# Patient Record
Sex: Male | Born: 1950
Health system: Southern US, Community
[De-identification: ages and names within clinical notes are randomized; demographics above are authoritative.]

## PROBLEM LIST (undated history)

## (undated) DIAGNOSIS — K760 Fatty (change of) liver, not elsewhere classified: Secondary | ICD-10-CM

## (undated) DIAGNOSIS — I5042 Chronic combined systolic (congestive) and diastolic (congestive) heart failure: Secondary | ICD-10-CM

## (undated) DIAGNOSIS — F1010 Alcohol abuse, uncomplicated: Secondary | ICD-10-CM

## (undated) DIAGNOSIS — M1990 Unspecified osteoarthritis, unspecified site: Secondary | ICD-10-CM

## (undated) DIAGNOSIS — E7800 Pure hypercholesterolemia, unspecified: Secondary | ICD-10-CM

## (undated) DIAGNOSIS — F99 Mental disorder, not otherwise specified: Secondary | ICD-10-CM

## (undated) DIAGNOSIS — I10 Essential (primary) hypertension: Secondary | ICD-10-CM

## (undated) HISTORY — PX: OTHER SURGICAL HISTORY: SHX169

## (undated) HISTORY — DX: Pure hypercholesterolemia, unspecified: E78.00

## (undated) HISTORY — DX: Essential (primary) hypertension: I10

## (undated) HISTORY — DX: Chronic combined systolic (congestive) and diastolic (congestive) heart failure: I50.42

## (undated) HISTORY — DX: Fatty (change of) liver, not elsewhere classified: K76.0

## (undated) HISTORY — DX: Alcohol abuse, uncomplicated: F10.10

---

## 1980-06-07 HISTORY — PX: HIP PINNING: SHX1757

## 2011-10-31 ENCOUNTER — Emergency Department (HOSPITAL_COMMUNITY)
Admission: EM | Admit: 2011-10-31 | Discharge: 2011-10-31 | Disposition: A | Discharge status: Home / Self Care | Payer: Medicaid Other | Attending: Emergency Medicine | Admitting: Emergency Medicine

## 2011-10-31 ENCOUNTER — Encounter (HOSPITAL_COMMUNITY): Payer: Self-pay | Admitting: *Deleted

## 2011-10-31 ENCOUNTER — Emergency Department (HOSPITAL_COMMUNITY): Payer: Medicaid Other

## 2011-10-31 DIAGNOSIS — M79609 Pain in unspecified limb: Secondary | ICD-10-CM | POA: Insufficient documentation

## 2011-10-31 DIAGNOSIS — M7989 Other specified soft tissue disorders: Secondary | ICD-10-CM | POA: Insufficient documentation

## 2011-10-31 DIAGNOSIS — M25579 Pain in unspecified ankle and joints of unspecified foot: Secondary | ICD-10-CM | POA: Insufficient documentation

## 2011-10-31 DIAGNOSIS — M79671 Pain in right foot: Secondary | ICD-10-CM

## 2011-10-31 MED ORDER — NAPROXEN 500 MG PO TABS: 500.0000 mg | ORAL_TABLET | Freq: Two times a day (BID) | ORAL | Status: AC

## 2011-10-31 NOTE — ED Notes (Signed)
Right foot swollen for the past 4 days

## 2011-10-31 NOTE — ED Provider Notes (Signed)
History     CSN: [id number]  Arrival date & time 10/31/11  1153   First MD Initiated Contact with Patient 10/31/11 1249      Chief Complaint  Patient presents with  . Foot Swelling    (Consider location/radiation/quality/duration/timing/severity/associated sxs/prior treatment) HPI Comments: Patient c/o "stinging" pain and swelling to his right foot and ankle for 4 days.  He denies known injury, calf pain or swelling of the proximal leg.  He states the pain is only with weight bearing and improves with rest.    Patient is a 61 y.o. male presenting with lower extremity pain. The history is provided by the patient.  Foot Pain This is a new problem. The current episode started in the past 7 days. The problem occurs constantly. The problem has been unchanged. Associated symptoms include arthralgias and joint swelling. Pertinent negatives include no chills, fever, headaches, numbness, rash, swollen glands, vomiting or weakness. The symptoms are aggravated by standing and walking. He has tried nothing for the symptoms. The treatment provided no relief.    History reviewed. No pertinent past medical history.  History reviewed. No pertinent past surgical history.  No family history on file.  History  Substance Use Topics  . Smoking status: Former Games developer  . Smokeless tobacco: Not on file  . Alcohol Use:       Review of Systems  Constitutional: Negative for fever and chills.  Respiratory: Negative for shortness of breath.   Gastrointestinal: Negative for vomiting.  Genitourinary: Negative for dysuria and difficulty urinating.  Musculoskeletal: Positive for joint swelling and arthralgias. Negative for gait problem.  Skin: Negative for color change, rash and wound.  Neurological: Negative for weakness, numbness and headaches.  All other systems reviewed and are negative.    Allergies  Review of patient's allergies indicates no known allergies.  Home Medications  No current  outpatient prescriptions on file.  BP 156/69  Pulse 73  Temp(Src) 97.7 F (36.5 C) (Oral)  Resp 18  SpO2 99%  Physical Exam  Nursing note and vitals reviewed. Constitutional: He is oriented to person, place, and time. He appears well-developed and well-nourished. No distress.  HENT:  Head: Normocephalic and atraumatic.  Cardiovascular: Normal rate, regular rhythm, normal heart sounds and intact distal pulses.   Pulmonary/Chest: Effort normal and breath sounds normal. No respiratory distress.  Musculoskeletal: He exhibits edema and tenderness.       right ankle is ttp, mild to moderate STS is present of the dorsal foot, medial and lateral ankle.  ROM is preserved.  DP pulse is brisk, sensation intact.  No erythema, abrasion, wounds, bruising or deformity.  No proximal edema or discoloration.    Neurological: He is alert and oriented to person, place, and time. He exhibits normal muscle tone. Coordination normal.  Skin: Skin is warm and dry.    ED Course  Procedures (including critical care time)  Labs Reviewed - No data to display Dg Foot Complete Right  10/31/2011  *RADIOLOGY REPORT*  Clinical Data: Right foot swollen and painful.  No known injury.  RIGHT FOOT COMPLETE - 3+ VIEW  Comparison: None.  Findings: Joints of the foot are aligned.  There is a remote healed fracture deformity of the first metatarsal.  No acute fracture or radiopaque foreign body is identified. No bony erosions or periosteal reaction. There is diffuse soft tissue swelling over the dorsum of the foot.  IMPRESSION: Soft tissue swelling of the dorsum of the foot.  No acute bony abnormality identified.  Original Report Authenticated By: Britta Mccreedy, M.D.     MDM   Previous medical charts, nursing notes and vitals signs from this visit were reviewed by me   All laboratory results and/or imaging results performed on this visit, if applicable, were reviewed by me and discussed with the patient and/or parent as  well as recommendation for follow-up    MEDICATIONS GIVEN IN ED:  none   Diffuse STS of the right dorsal foot and ankle.  Clinical suspicion for cellulitis, or DVT is low.  Patient also  seen by EDP and care plan discussed.  Pt agrees to return or f/u with his PMD if sx's worsen   PRESCRIPTIONS GIVEN AT DISCHARGE:  naprosyn    Pt stable in ED with no significant deterioration in condition. Pt feels improved after observation and/or treatment in ED. Patient / Family / Caregiver understand and agree with initial ED impression and plan with expectations set for ED visit.  Patient agrees to return to ED for any worsening symptoms         Charisse Wendell L. Keirstin Musil, [place] 11/02/11 1213

## 2011-11-02 NOTE — ED Provider Notes (Signed)
Medical screening examination/treatment/procedure(s) were performed by non-physician practitioner and as supervising physician I was immediately available for consultation/collaboration.   Shareka Casale L Levita Monical, MD 11/02/11 1523 

## 2012-06-22 ENCOUNTER — Telehealth: Payer: Self-pay

## 2012-06-22 NOTE — Telephone Encounter (Signed)
T/C from Maryelizabeth Rowan at [facility]. She said this pt is being referred by [facility] for screening colonoscopy. I do not have a referral. She  [place] Spaniel he has a hx of substance abuse and she is not sure what. I scheduled him an OV with Gerrit Halls, NP on 07/17/2012 at 8:30 AM and [name] will bring him and be with him. Routing to Dawn to get records from [facility] ( although [facility] said that pt had a work up there but no previous records.)

## 2012-06-22 NOTE — Telephone Encounter (Signed)
Left a message with Gennaro Africa, [facility], to send me his last OV note.

## 2012-06-27 NOTE — Telephone Encounter (Signed)
Left a message again today for Gabriel Hammond to call me back regarding these records.

## 2012-06-28 NOTE — Telephone Encounter (Signed)
Received records

## 2012-06-28 NOTE — Telephone Encounter (Signed)
Gabriel Hammond from [facility]	[place] records is faxing [place] this patients records.

## 2012-07-17 ENCOUNTER — Ambulatory Visit (INDEPENDENT_AMBULATORY_CARE_PROVIDER_SITE_OTHER): Payer: Medicaid Other | Admitting: Gastroenterology

## 2012-07-17 ENCOUNTER — Encounter: Payer: Self-pay | Admitting: Gastroenterology

## 2012-07-17 VITALS — BP 157/83 | HR 103 | Temp 97.4°F | Ht 68.0 in | Wt 153.4 lb

## 2012-07-17 DIAGNOSIS — Z1211 Encounter for screening for malignant neoplasm of colon: Secondary | ICD-10-CM | POA: Insufficient documentation

## 2012-07-17 DIAGNOSIS — D649 Anemia, unspecified: Secondary | ICD-10-CM

## 2012-07-17 LAB — CBC WITH DIFFERENTIAL/PLATELET
Basophils Absolute: 0 10*3/uL (ref 0.0–0.1)
Basophils Relative: 0 % (ref 0–1)
Eosinophils Absolute: 0.1 10*3/uL (ref 0.0–0.7)
Eosinophils Relative: 1 % (ref 0–5)
HCT: 37.6 % — ABNORMAL LOW (ref 39.0–52.0)
Hemoglobin: 12.4 g/dL — ABNORMAL LOW (ref 13.0–17.0)
Lymphocytes Relative: 25 % (ref 12–46)
Lymphs Abs: 1.1 10*3/uL (ref 0.7–4.0)
MCH: 27 pg (ref 26.0–34.0)
MCHC: 33 g/dL (ref 30.0–36.0)
MCV: 81.7 fL (ref 78.0–100.0)
Monocytes Absolute: 0.4 10*3/uL (ref 0.1–1.0)
Monocytes Relative: 9 % (ref 3–12)
Neutro Abs: 2.9 10*3/uL (ref 1.7–7.7)
Neutrophils Relative %: 65 % (ref 43–77)
Platelets: 366 10*3/uL (ref 150–400)
RBC: 4.6 MIL/uL (ref 4.22–5.81)
RDW: 16.1 % — ABNORMAL HIGH (ref 11.5–15.5)
WBC: 4.5 10*3/uL (ref 4.0–10.5)

## 2012-07-17 LAB — FERRITIN: Ferritin: 799 ng/mL — ABNORMAL HIGH (ref 22–322)

## 2012-07-17 LAB — IRON: Iron: 57 ug/dL (ref 42–165)

## 2012-07-17 MED ORDER — PEG 3350-KCL-NA BICARB-NACL 420 G PO SOLR: 4000.0000 mL | ORAL | Status: DC

## 2012-07-17 NOTE — Progress Notes (Signed)
Faxed to PCP

## 2012-07-17 NOTE — Assessment & Plan Note (Signed)
Mild normocytic. hgb 12. Update CBC, ferritin, iron. EGD if IDA.

## 2012-07-17 NOTE — Assessment & Plan Note (Signed)
62 year old male presenting for initial screening colonoscopy, overdue at this time. No lower GI symptoms. He thinks his father may have had colon cancer in his 4s or 55s, but [name] is unsure. It is somewhat difficult to obtain an accurate family history from him. Upon review of labs, Hgb mild normocytic anemia noted with Hgb 12. Pt has been without a primary care physician for 30+ years.   I would like to obtain an updated CBC and establish a baseline iron and ferritin. If evidence of IDA, needs EGD at time of TCS. Heme status unknown.   Proceed with TCS+/- EGD with Dr. Jena Gauss in near future: the risks, benefits, and alternatives have been discussed with the patient in detail. The patient states understanding and desires to proceed.

## 2012-07-17 NOTE — Patient Instructions (Addendum)
We have set you up for a colonoscopy and possibly upper endoscopy with Dr. Jena Gauss in the near future.  Please complete the blood work within the next 24 hours. This will help [place] decide if we need to take a look at your esophagus and stomach at the time of the colonoscopy.
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
## 2012-07-26 LAB — CBC
HCT: 37 %
Hemoglobin: 12 g/dL — AB (ref 13.5–17.5)
MCH: 28.1
MCHC: 32.6
MCV: 86.2 fL
RBC: 4.27
RDW: 16.2
WBC: 5.2
platelet count: 280

## 2012-07-31 ENCOUNTER — Encounter (HOSPITAL_COMMUNITY): Payer: Self-pay | Admitting: Pharmacy Technician

## 2012-07-31 NOTE — Progress Notes (Signed)
Quick Note:  Hgb stable at 12.4 Iron normal Ferritin is double the upper limits of normal. Unclear etiology at this point. Could be a number of factors; we need to obtain a transferrin level. Will help [place] decide which route to take (?liver evaluation, hematology, etc) ______

## 2012-08-03 ENCOUNTER — Other Ambulatory Visit: Payer: Self-pay | Admitting: Gastroenterology

## 2012-08-03 ENCOUNTER — Other Ambulatory Visit: Payer: Self-pay

## 2012-08-03 DIAGNOSIS — R7989 Other specified abnormal findings of blood chemistry: Secondary | ICD-10-CM

## 2012-08-03 NOTE — Progress Notes (Signed)
Quick Note:  Tried to call pt- NA ______ 

## 2012-08-04 NOTE — Progress Notes (Signed)
Quick Note:  Tried to call pt- NA ______ 

## 2012-08-08 NOTE — Progress Notes (Signed)
Quick Note:  I have been unable to reach pt- Mailed letter with lab order. ______

## 2012-08-10 ENCOUNTER — Telehealth: Payer: Self-pay

## 2012-08-10 NOTE — Telephone Encounter (Signed)
Pt had received a lab order for ferritin recheck and wanted to know if he can still do his colonoscopy on Mon if he doesn't do this lab first. Per Gerrit Halls, NP , Yes and they are aware.

## 2012-08-14 ENCOUNTER — Ambulatory Visit (HOSPITAL_COMMUNITY)
Admission: RE | Admit: 2012-08-14 | Discharge: 2012-08-14 | Disposition: A | Discharge status: Home / Self Care | Payer: Medicaid Other | Source: Ambulatory Visit | Attending: Internal Medicine | Admitting: Internal Medicine

## 2012-08-14 ENCOUNTER — Encounter (HOSPITAL_COMMUNITY)
Admission: RE | Disposition: A | Discharge status: Home / Self Care | Payer: Self-pay | Source: Ambulatory Visit | Attending: Internal Medicine

## 2012-08-14 ENCOUNTER — Encounter (HOSPITAL_COMMUNITY): Payer: Self-pay | Admitting: *Deleted

## 2012-08-14 DIAGNOSIS — D126 Benign neoplasm of colon, unspecified: Secondary | ICD-10-CM

## 2012-08-14 DIAGNOSIS — D129 Benign neoplasm of anus and anal canal: Secondary | ICD-10-CM

## 2012-08-14 DIAGNOSIS — Z800 Family history of malignant neoplasm of digestive organs: Secondary | ICD-10-CM | POA: Insufficient documentation

## 2012-08-14 DIAGNOSIS — I10 Essential (primary) hypertension: Secondary | ICD-10-CM | POA: Insufficient documentation

## 2012-08-14 DIAGNOSIS — D649 Anemia, unspecified: Secondary | ICD-10-CM

## 2012-08-14 DIAGNOSIS — E7800 Pure hypercholesterolemia, unspecified: Secondary | ICD-10-CM | POA: Insufficient documentation

## 2012-08-14 DIAGNOSIS — Z1211 Encounter for screening for malignant neoplasm of colon: Secondary | ICD-10-CM

## 2012-08-14 DIAGNOSIS — D128 Benign neoplasm of rectum: Secondary | ICD-10-CM

## 2012-08-14 HISTORY — PX: COLONOSCOPY: SHX5424

## 2012-08-14 SURGERY — COLONOSCOPY
Anesthesia: Moderate Sedation

## 2012-08-14 MED ORDER — SODIUM CHLORIDE 0.45 % IV SOLN
INTRAVENOUS | Status: DC
  Administered 2012-08-14: 09:00:00 via INTRAVENOUS

## 2012-08-14 MED ORDER — MIDAZOLAM HCL 5 MG/5ML IJ SOLN
INTRAMUSCULAR | Status: DC | PRN
  Administered 2012-08-14 (×2): 2 mg via INTRAVENOUS

## 2012-08-14 MED ORDER — ONDANSETRON HCL 4 MG/2ML IJ SOLN
INTRAMUSCULAR | Status: DC | PRN
  Administered 2012-08-14: 4 mg via INTRAVENOUS

## 2012-08-14 MED ORDER — ONDANSETRON HCL 4 MG/2ML IJ SOLN
INTRAMUSCULAR | Status: AC
  Filled 2012-08-14: qty 2

## 2012-08-14 MED ORDER — STERILE WATER FOR IRRIGATION IR SOLN
Status: DC | PRN
  Administered 2012-08-14: 10:00:00

## 2012-08-14 MED ORDER — MEPERIDINE HCL 100 MG/ML IJ SOLN
INTRAMUSCULAR | Status: DC | PRN
  Administered 2012-08-14 (×2): 50 mg via INTRAVENOUS

## 2012-08-14 MED ORDER — MEPERIDINE HCL 100 MG/ML IJ SOLN
INTRAMUSCULAR | Status: AC
  Filled 2012-08-14: qty 2

## 2012-08-14 MED ORDER — MIDAZOLAM HCL 5 MG/5ML IJ SOLN
INTRAMUSCULAR | Status: AC
  Filled 2012-08-14: qty 10
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
## 2012-08-14 NOTE — Interval H&P Note (Signed)
History and Physical Interval Note:  08/14/2012 10:02 AM  Gabriel Hammond  has presented today for surgery, with the diagnosis of SCREENING COLONOSCOPY AND ANEMIA  The various methods of treatment have been discussed with the patient and family. After consideration of risks, benefits and other options for treatment, the patient has consented to  Procedure(s) with comments: COLONOSCOPY (N/A) - AT 9:30 PER PATIENT REQUEST ESOPHAGOGASTRODUODENOSCOPY (EGD) (N/A) as a surgical intervention .  [name] history has been reviewed, patient examined, no change in status, stable for surgery.  I have reviewed the patient's chart and labs.  Questions were answered to the patient's satisfaction.     [name] First ever screening colonoscopy per plan. Patient has a high ferritin snd normal iron. No upper GI tract symptoms. No indication for EGD this time.  The risks, benefits, limitations, alternatives and imponderables have been reviewed with the patient. Questions have been answered. All parties are agreeable.  Addendum: Regular, heavy alcohol consumption until 2 weeks ago when sister asked him to stop. He complied.

## 2012-08-14 NOTE — Op Note (Signed)
[facility] [address] [place], [address]   COLONOSCOPY PROCEDURE REPORT  PATIENT: Gabriel, Hammond  MR#:         [id number] BIRTHDATE: 02-Apr-1951 , 61  yrs. old GENDER: Male ENDOSCOPIST: R.  Roetta Sessions, MD [facility] REFERRED BY:  Kizzie Furnish, [place] PROCEDURE DATE:  08/14/2012 PROCEDURE:     Colonoscopy with biopsy and snare polypectomy  INDICATIONS: First-ever high risk repeat colonoscopy (father with colon cancer)  INFORMED CONSENT:  The risks, benefits, alternatives and imponderables including but not limited to bleeding, perforation as well as the possibility of a missed lesion have been reviewed.  The potential for biopsy, lesion removal, etc. have also been discussed.  Questions have been answered.  All parties agreeable. Please see the history and physical in the medical record for more information.  MEDICATIONS: Versed 4 mg IV and Demerol 100 mg IV in divided doses. Zofran 4 mg IV  DESCRIPTION OF PROCEDURE:  After a digital rectal exam was performed, the EC-[id number] ([id number])  colonoscope was advanced from the anus through the rectum and colon to the area of the cecum, ileocecal valve and appendiceal orifice.  The cecum was deeply intubated.  These structures were well-seen and photographed for the record.  From the level of the cecum and ileocecal valve, the scope was slowly and cautiously withdrawn.  The mucosal surfaces were carefully surveyed utilizing scope tip deflection to facilitate fold flattening as needed.  The scope was pulled down into the rectum where a thorough examination including retroflexion was performed.    FINDINGS:  Adequate preparation. It Normal rectum. (1) diminutive polyp in the mid descending segment; (1) 5 mm Penta polyp in the mid sigmoid segment; otherwise, the remainder of the colonic mucosa appeared normal.  THERAPEUTIC / DIAGNOSTIC MANEUVERS PERFORMED:  The above-mentioned posture cold biopsied and hot snare  removed, respectively.  COMPLICATIONS: None  CECAL WITHDRAWAL TIME:  16 minutes  IMPRESSION:  Colonic polyps-removed as described above. Patient recently found have an elevated ferritin. This is inthe setting of fairly heavy, regular alcohol abuse. He tells me he stopped drinking 2 weeks ago.  RECOMMENDATIONS: Followup on pathology. Continue to refrain from alcohol ingestion. Office visit with repeat ferritin in 3 months to   _______________________________ eSigned:  R. Roetta Sessions, MD FACP [facility] 08/14/2012 10:53 AM   CC:

## 2012-08-16 ENCOUNTER — Encounter (HOSPITAL_COMMUNITY): Payer: Self-pay | Admitting: Internal Medicine

## 2012-08-20 ENCOUNTER — Encounter: Payer: Self-pay | Admitting: Internal Medicine

## 2012-08-21 ENCOUNTER — Encounter: Payer: Self-pay | Admitting: *Deleted

## 2012-11-06 LAB — TRANSFERRIN: Transferrin: 282 mg/dL (ref 200–360)

## 2012-11-14 ENCOUNTER — Ambulatory Visit: Payer: 59 | Admitting: Gastroenterology

## 2012-11-15 ENCOUNTER — Encounter: Payer: Self-pay | Admitting: Internal Medicine

## 2012-11-16 ENCOUNTER — Ambulatory Visit (INDEPENDENT_AMBULATORY_CARE_PROVIDER_SITE_OTHER): Payer: Medicaid Other | Admitting: Gastroenterology

## 2012-11-16 ENCOUNTER — Encounter: Payer: Self-pay | Admitting: Gastroenterology

## 2012-11-16 ENCOUNTER — Ambulatory Visit: Payer: 59 | Admitting: Gastroenterology

## 2012-11-16 VITALS — BP 175/92 | HR 100 | Temp 98.6°F | Ht 68.0 in | Wt 148.2 lb

## 2012-11-16 DIAGNOSIS — R7989 Other specified abnormal findings of blood chemistry: Secondary | ICD-10-CM

## 2012-11-16 LAB — FERRITIN: Ferritin: 1054 ng/mL — ABNORMAL HIGH (ref 22–322)

## 2012-11-16 NOTE — Assessment & Plan Note (Signed)
Returns for f/u after TCS noting adenomatous polyps. Due for surveillance in 2019. No lower or upper GI symptoms currently. Continues to drink ETOH heavily. Likely this is source of elevated ferritin. Transferrin level normal. Repeat ferritin today. 5 lbs weight loss since last visit; likely multifactorial. Add boost or ensure supplements. Weight check in 6-8 weeks.
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
## 2012-11-16 NOTE — Progress Notes (Signed)
Cc PCP 

## 2012-11-16 NOTE — Patient Instructions (Addendum)
Please return for a simple weight check in 6-8 weeks. No office visit needed at that time.  Use Boost or Ensure as supplements for nutrition. You may use 2 a day as snacks, or use for breakfast.   Please have blood work done. We will call with results!

## 2012-12-13 NOTE — Progress Notes (Signed)
Quick Note:  Ferritin persistently elevated in the setting of ETOH abuse.  Let's obtain HFP, [place] of abdomen ______

## 2012-12-14 ENCOUNTER — Other Ambulatory Visit: Payer: Self-pay

## 2012-12-14 ENCOUNTER — Other Ambulatory Visit: Payer: Self-pay | Admitting: Gastroenterology

## 2012-12-14 DIAGNOSIS — R7989 Other specified abnormal findings of blood chemistry: Secondary | ICD-10-CM

## 2012-12-14 DIAGNOSIS — D649 Anemia, unspecified: Secondary | ICD-10-CM

## 2012-12-14 NOTE — Progress Notes (Signed)
Patient is scheduled for U/S abd on Tues July 15th at 9:00

## 2012-12-15 LAB — HEPATIC FUNCTION PANEL
ALT: 24 U/L (ref 0–53)
AST: 27 U/L (ref 0–37)
Albumin: 4.9 g/dL (ref 3.5–5.2)
Alkaline Phosphatase: 153 U/L — ABNORMAL HIGH (ref 39–117)
Bilirubin, Direct: 0.1 mg/dL (ref 0.0–0.3)
Indirect Bilirubin: 0.1 mg/dL (ref 0.0–0.9)
Total Bilirubin: 0.2 mg/dL — ABNORMAL LOW (ref 0.3–1.2)
Total Protein: 7.5 g/dL (ref 6.0–8.3)

## 2012-12-19 ENCOUNTER — Ambulatory Visit (HOSPITAL_COMMUNITY)
Admission: RE | Admit: 2012-12-19 | Discharge: 2012-12-19 | Disposition: A | Discharge status: Home / Self Care | Payer: Medicaid Other | Source: Ambulatory Visit | Attending: Gastroenterology | Admitting: Gastroenterology

## 2012-12-19 DIAGNOSIS — R7989 Other specified abnormal findings of blood chemistry: Secondary | ICD-10-CM

## 2012-12-19 DIAGNOSIS — R109 Unspecified abdominal pain: Secondary | ICD-10-CM | POA: Insufficient documentation

## 2013-01-05 NOTE — Progress Notes (Signed)
Quick Note:  Let's repeat HFP in 3 months, fasting. Needs to abstain from ETOH. Will recheck ferritin at that time. It remains persistently elevated in the setting of ETOH abuse.  Let's have him come back in for an office visit in 3 months. Needs weight check now (but no OV). ______

## 2013-01-16 ENCOUNTER — Other Ambulatory Visit: Payer: Self-pay

## 2013-01-16 DIAGNOSIS — R7989 Other specified abnormal findings of blood chemistry: Secondary | ICD-10-CM

## 2013-01-16 DIAGNOSIS — D649 Anemia, unspecified: Secondary | ICD-10-CM

## 2013-01-19 ENCOUNTER — Encounter: Payer: Self-pay | Admitting: Gastroenterology

## 2013-01-24 ENCOUNTER — Encounter (HOSPITAL_COMMUNITY): Payer: Self-pay | Admitting: *Deleted

## 2013-01-24 ENCOUNTER — Emergency Department (HOSPITAL_COMMUNITY)
Admission: EM | Admit: 2013-01-24 | Discharge: 2013-01-24 | Disposition: A | Discharge status: Home / Self Care | Payer: MEDICAID | Source: Home / Self Care | Attending: Emergency Medicine | Admitting: Emergency Medicine

## 2013-01-24 ENCOUNTER — Inpatient Hospital Stay (HOSPITAL_COMMUNITY)
Admission: AD | Admit: 2013-01-24 | Discharge: 2013-01-29 | DRG: 897 | Disposition: A | Discharge status: Home / Self Care | Payer: MEDICAID | Source: Intra-hospital | Attending: Psychiatry | Admitting: Psychiatry

## 2013-01-24 DIAGNOSIS — Z7982 Long term (current) use of aspirin: Secondary | ICD-10-CM | POA: Insufficient documentation

## 2013-01-24 DIAGNOSIS — Z862 Personal history of diseases of the blood and blood-forming organs and certain disorders involving the immune mechanism: Secondary | ICD-10-CM | POA: Insufficient documentation

## 2013-01-24 DIAGNOSIS — F1020 Alcohol dependence, uncomplicated: Secondary | ICD-10-CM

## 2013-01-24 DIAGNOSIS — F489 Nonpsychotic mental disorder, unspecified: Secondary | ICD-10-CM | POA: Diagnosis present

## 2013-01-24 DIAGNOSIS — R259 Unspecified abnormal involuntary movements: Secondary | ICD-10-CM | POA: Insufficient documentation

## 2013-01-24 DIAGNOSIS — I10 Essential (primary) hypertension: Secondary | ICD-10-CM | POA: Diagnosis present

## 2013-01-24 DIAGNOSIS — F10239 Alcohol dependence with withdrawal, unspecified: Principal | ICD-10-CM | POA: Diagnosis present

## 2013-01-24 DIAGNOSIS — F1010 Alcohol abuse, uncomplicated: Secondary | ICD-10-CM

## 2013-01-24 DIAGNOSIS — Z8639 Personal history of other endocrine, nutritional and metabolic disease: Secondary | ICD-10-CM | POA: Insufficient documentation

## 2013-01-24 DIAGNOSIS — Z23 Encounter for immunization: Secondary | ICD-10-CM

## 2013-01-24 DIAGNOSIS — F10939 Alcohol use, unspecified with withdrawal, unspecified: Principal | ICD-10-CM | POA: Diagnosis present

## 2013-01-24 DIAGNOSIS — E7800 Pure hypercholesterolemia, unspecified: Secondary | ICD-10-CM | POA: Diagnosis present

## 2013-01-24 DIAGNOSIS — Z87891 Personal history of nicotine dependence: Secondary | ICD-10-CM | POA: Insufficient documentation

## 2013-01-24 HISTORY — DX: Mental disorder, not otherwise specified: F99

## 2013-01-24 LAB — BASIC METABOLIC PANEL
BUN: 13 mg/dL (ref 6–23)
CO2: 25 mEq/L (ref 19–32)
Calcium: 9.7 mg/dL (ref 8.4–10.5)
Chloride: 105 mEq/L (ref 96–112)
Creatinine, Ser: 0.74 mg/dL (ref 0.50–1.35)
GFR calc Af Amer: >90 mL/min (ref >90)
GFR calc non Af Amer: >90 mL/min (ref >90)
Glucose, Bld: 79 mg/dL (ref 70–99)
Potassium: 3.7 mEq/L (ref 3.5–5.1)
Sodium: 143 mEq/L (ref 135–145)

## 2013-01-24 LAB — ETHANOL: Alcohol, Ethyl (B): 226 mg/dL — ABNORMAL HIGH (ref 0–11)

## 2013-01-24 LAB — CBC
HCT: 35.7 % — ABNORMAL LOW (ref 39.0–52.0)
Hemoglobin: 11.8 g/dL — ABNORMAL LOW (ref 13.0–17.0)
MCH: 30.1 pg (ref 26.0–34.0)
MCHC: 33.1 g/dL (ref 30.0–36.0)
MCV: 91.1 fL (ref 78.0–100.0)
Platelets: 290 10*3/uL (ref 150–400)
RBC: 3.92 MIL/uL — ABNORMAL LOW (ref 4.22–5.81)
RDW: 15.5 % (ref 11.5–15.5)
WBC: 4.6 10*3/uL (ref 4.0–10.5)

## 2013-01-24 NOTE — ED Notes (Signed)
Called [facility] to make them award of transfer, no answer, left message for them to call back.

## 2013-01-24 NOTE — ED Notes (Signed)
Left message with Renda Rolls at [facility] for report, Consent signed and faxed.
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
## 2013-01-24 NOTE — ED Notes (Addendum)
Pt states he needs help getting off alcohol. States he has been drinking since the age of 12. States he drinks ~ "a fifth or a pint or day" Pt last had alcohol yesterday. Pt denies SI/HI

## 2013-01-24 NOTE — ED Notes (Signed)
Telepsych in progress. 

## 2013-01-24 NOTE — ED Notes (Signed)
MD at bedside. 

## 2013-01-24 NOTE — ED Notes (Signed)
Consult will be at 1830.

## 2013-01-24 NOTE — ED Notes (Addendum)
Patient reports seeking help for detox from ETOH. Patient states he has been drinking since he was 62 years old. Denies SI/HI but does state that "I'm afraid it will get there (speaking about SI)". Per patient, no prior attempt to detox from ETOH, but he reports attempting to "stop on my own once", stating he went 4-5 days without drinking. States he got "really shaky and I couldn't hold on to stuff and was unsteady". Cooperative and calm with staff. Patient changed into blue scrubs. Belongings obtained and locked in ED locker.

## 2013-01-24 NOTE — ED Notes (Signed)
[facility] here for pt, belonging given to [facility]

## 2013-01-24 NOTE — ED Notes (Signed)
Pt states last drink was around 11am today before coming into ED. Pt starting to feel little shaky.

## 2013-01-24 NOTE — ED Notes (Signed)
Gave pt a warm blanket, and ice water, pt unable to void at this time, has urinal at the bedside.

## 2013-01-25 ENCOUNTER — Encounter (HOSPITAL_COMMUNITY): Payer: Self-pay

## 2013-01-25 DIAGNOSIS — F1994 Other psychoactive substance use, unspecified with psychoactive substance-induced mood disorder: Secondary | ICD-10-CM

## 2013-01-25 DIAGNOSIS — F1020 Alcohol dependence, uncomplicated: Secondary | ICD-10-CM

## 2013-01-25 MED ORDER — MAGNESIUM HYDROXIDE 400 MG/5ML PO SUSP: 30.0000 mL | Freq: Every day | ORAL | Status: DC | PRN

## 2013-01-25 MED ORDER — VITAMIN B-1 100 MG PO TABS
100.0000 mg | ORAL_TABLET | Freq: Every day | ORAL | Status: DC
  Administered 2013-01-26 – 2013-01-29 (×4): 100 mg via ORAL
  Filled 2013-01-25 (×6): qty 1

## 2013-01-25 MED ORDER — ALUM & MAG HYDROXIDE-SIMETH 200-200-20 MG/5ML PO SUSP: 30.0000 mL | ORAL | Status: DC | PRN

## 2013-01-25 MED ORDER — ONDANSETRON 4 MG PO TBDP: 4.0000 mg | ORAL_TABLET | Freq: Four times a day (QID) | ORAL | Status: AC | PRN

## 2013-01-25 MED ORDER — PNEUMOCOCCAL VAC POLYVALENT 25 MCG/0.5ML IJ INJ
0.5000 mL | INJECTION | INTRAMUSCULAR | Status: AC
  Administered 2013-01-26: 0.5 mL via INTRAMUSCULAR

## 2013-01-25 MED ORDER — TRAZODONE HCL 50 MG PO TABS: 50.0000 mg | ORAL_TABLET | Freq: Every evening | ORAL | Status: DC | PRN

## 2013-01-25 MED ORDER — CHLORDIAZEPOXIDE HCL 25 MG PO CAPS
25.0000 mg | ORAL_CAPSULE | Freq: Every day | ORAL | Status: AC
  Administered 2013-01-29: 25 mg via ORAL
  Filled 2013-01-25: qty 1

## 2013-01-25 MED ORDER — THIAMINE HCL 100 MG/ML IJ SOLN: 100.0000 mg | Freq: Once | INTRAMUSCULAR | Status: DC

## 2013-01-25 MED ORDER — ADULT MULTIVITAMIN W/MINERALS CH
1.0000 | ORAL_TABLET | Freq: Every day | ORAL | Status: DC
  Administered 2013-01-26 – 2013-01-29 (×4): 1 via ORAL
  Filled 2013-01-25 (×7): qty 1

## 2013-01-25 MED ORDER — CHLORDIAZEPOXIDE HCL 25 MG PO CAPS: 25.0000 mg | ORAL_CAPSULE | Freq: Four times a day (QID) | ORAL | Status: AC | PRN

## 2013-01-25 MED ORDER — ACETAMINOPHEN 325 MG PO TABS
650.0000 mg | ORAL_TABLET | Freq: Four times a day (QID) | ORAL | Status: DC | PRN
  Administered 2013-01-25 – 2013-01-29 (×3): 650 mg via ORAL

## 2013-01-25 MED ORDER — CHLORDIAZEPOXIDE HCL 25 MG PO CAPS
25.0000 mg | ORAL_CAPSULE | Freq: Four times a day (QID) | ORAL | Status: AC
  Administered 2013-01-25 – 2013-01-26 (×6): 25 mg via ORAL
  Filled 2013-01-25 (×5): qty 1

## 2013-01-25 MED ORDER — CHLORDIAZEPOXIDE HCL 25 MG PO CAPS
25.0000 mg | ORAL_CAPSULE | ORAL | Status: AC
  Administered 2013-01-27 – 2013-01-28 (×2): 25 mg via ORAL
  Filled 2013-01-25 (×3): qty 1

## 2013-01-25 MED ORDER — HYDROXYZINE HCL 25 MG PO TABS: 25.0000 mg | ORAL_TABLET | Freq: Four times a day (QID) | ORAL | Status: AC | PRN

## 2013-01-25 MED ORDER — TRAZODONE HCL 50 MG PO TABS
50.0000 mg | ORAL_TABLET | Freq: Every evening | ORAL | Status: DC | PRN
  Administered 2013-01-25 – 2013-01-28 (×5): 50 mg via ORAL
  Filled 2013-01-25 (×3): qty 1
  Filled 2013-01-25: qty 28
  Filled 2013-01-25 (×2): qty 1
  Filled 2013-01-25: qty 28
  Filled 2013-01-25 (×6): qty 1

## 2013-01-25 MED ORDER — LOPERAMIDE HCL 2 MG PO CAPS: 2.0000 mg | ORAL_CAPSULE | ORAL | Status: AC | PRN

## 2013-01-25 MED ORDER — CHLORDIAZEPOXIDE HCL 25 MG PO CAPS
25.0000 mg | ORAL_CAPSULE | Freq: Three times a day (TID) | ORAL | Status: AC
  Administered 2013-01-26 – 2013-01-27 (×3): 25 mg via ORAL
  Filled 2013-01-25 (×3): qty 1

## 2013-01-25 NOTE — Progress Notes (Signed)
Patient ID: Gabriel Hammond, male   DOB: 07-16-1950, 62 y.o.   MRN: [id number] Admission note: D:Patient is a  Voluntary admission in no acute distress for ETOH detox. Pt stated he is tired of drinking and wants to see his grandchildren grow up. Pt reports that his doctor told him if he does seek treatment he will die. Pt report drinking since age 6 but increased drinking after the death of his mother nearly 2 years ago. Pt stated he was very close to her mother. Pt stated he lives alone in his apartment and has a case worker who checks on him and take him to his appointment and pickup medications. Pt is calm and pleasant.  A: Pt admitted to unit per protocol, skin assessment and belonging search done. No skin issues noted. Consent signed by pt. Pt educated on therapeutic milieu rules. Pt was introduced to milieu by nursing staff. Fall risk safety plan explained to the patient. 15 minutes checks started for safety.  R: Pt was receptive to education. Writer offered support.

## 2013-01-25 NOTE — Progress Notes (Signed)
Patient ID: Gabriel Hammond, male   DOB: [birth year], 62 y.o.   MRN: [id number] D: Pt is awake and active on the unit this AM. Pt denies SI/HI and A/V hallucinations. Pt's most recent CIWA score was 5. Pt mood is depressed and his affect is appropriate. Pt writes that he wants to "give up ETOH for good." Pt is somewhat withdrawn but he is cooperative with staff.  A: Encouraged pt to discuss feelings with staff and administered medication per MD orders. Writer also encouraged pt to participate in groups.  R: Pt is attending groups and tolerating medications well. Writer will continue to monitor. 15 minute checks are ongoing for safety.
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
## 2013-01-25 NOTE — [facility] Group Notes (Signed)
[facility] LCSW Group Therapy  01/25/2013 3:29 PM  Type of Therapy:  Group Therapy  Participation Level:  Minimal  Participation Quality:  Attentive  Affect:  Appropriate  Cognitive:  Alert  Insight:  Limited  Engagement in Therapy:  Limited  Modes of Intervention:  Discussion, Education, Exploration, Socialization and Support  Summary of Progress/Problems:  Finding Balance in Life. Today's group focused on defining balance in one's own words, identifying things that can knock one off balance, and exploring healthy ways to maintain balance in life. Group members were asked to provide an example of a time when they felt off balance, describe how they handled that situation,and process healthier ways to regain balance in the future. Group members were asked to share the most important tool for maintaining balance that they learned while at [facility] and how they plan to apply this method after discharge. Gabriel Hammond was attentive throughout today's group but did not participate often. He stated that balance meant "getting rid of negative things and focusing on positive things." Gabriel Hammond listened as others shared their thoughts about today's topic. He stated that he plans to join a gym after leaving [facility] in an attempt to take better care of his body. Gabriel Hammond shows limited progress in the group setting AEB his limited level of participation and inability to explore other areas in his life that are in need of balance, demonstrating limited insight.    Smart, Gabriel Hammond 01/25/2013, 3:29 PM

## 2013-01-25 NOTE — [facility] Suicide Risk Assessment (Signed)
[facility] INPATIENT: Family/Significant Other Suicide Prevention Education  Suicide Prevention Education:  Education Completed; No one has been identified by the patient as the family member/significant other with whom the patient will be residing, and identified as the person(s) who will aid the patient in the event of a mental health crisis (suicidal ideations/suicide attempt).   Pt did not c/o SI at admission, nor have they endorsed SI during their stay here. SPE not required. Pt given SPI pamphlet and encouraged to ask questions and talk about concerns relating to SPE.   [employer], LCSWA 01/25/2013 11:15 AM

## 2013-01-25 NOTE — Progress Notes (Signed)
Patient ID: Gabriel Hammond, male   DOB: 19-Oct-1950, 62 y.o.   MRN: [id number] He has been up and to groups interacting with peers and staff. Self inventory:depression good, no hopelessness. C/o diarrhea  ,has not requested PR, Says that his legs bother him sometimes, His plan is to stop drinking.

## 2013-01-25 NOTE — Progress Notes (Signed)
Adult Psychoeducational Group Note  Date:  01/25/2013 Time:  2:21 PM  Group Topic/Focus:  Building Self Esteem:   The Focus of this group is helping patients become aware of the effects of self-esteem on their lives, the things they and others do that enhance or undermine their self-esteem, seeing the relationship between their level of self-esteem and the choices they make and learning ways to enhance self-esteem.  Participation Level:  Active  Participation Quality:  Appropriate and Attentive  Affect:  Appropriate  Cognitive:  Alert and Appropriate  Insight: Good  Engagement in Group:  Engaged  Modes of Intervention:  Discussion, Socialization and Support  Additional Comments:  Pt came to group and shared that lack of exercise and substance abuse were effecting his self-esteem. Pt plans on changing this by riding his bike more and going to AA and his other programs to increase his self-esteem.  Cathlean Cower 01/25/2013, 2:21 PM
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
## 2013-01-26 DIAGNOSIS — F411 Generalized anxiety disorder: Secondary | ICD-10-CM

## 2013-01-26 DIAGNOSIS — F329 Major depressive disorder, single episode, unspecified: Secondary | ICD-10-CM

## 2013-01-26 NOTE — Tx Team (Signed)
Interdisciplinary Treatment Plan Update (Adult)  Date: 01/26/2013   Time Reviewed: 3:14 PM  Progress in Treatment:  Attending groups: Yes Participating in groups:   Yes Taking medication as prescribed: Yes  Tolerating medication: Yes  Family/Significant othe contact made: No. SPE not required for this pt.  Patient understands diagnosis: Yes, AEB seeking treatment for ETOH detox and depression.  Discussing patient identified problems/goals with staff: Yes  Medical problems stabilized or resolved: Yes  Denies suicidal/homicidal ideation: Yes during admission and self report/group.  Patient has not harmed self or Others: Yes  New problem(s) identified: n/a  Discharge Plan or Barriers: Pt plans to followup at [facility] for med management and SA IOP. He plans to return home to his apt with girlfriend after d/c from [facility].  Additional comments:Gabriel Hammond is an 62 y.o. male. Patient presents to [facility] voluntarily with C/O alcohol intoxication requesting etoh detox.Pt reports that he rode his bicycle to [facility]. Pt reports that his drinking has gotten increasingly worse since the death of his mother within the past year. Pt becomes tearful during assessment when speaking about his mother. Pt reports that he was very close to his mother and she was like a sister to him. Pt reports "it hurt me bad when she died". The pt's mothers death still appears to be a significant stressor for him. Pt reports that he drinks a 1/5 of etoh daily in the form of beer,wine, and liquor. Pt reports that he consumed his last drink of etoh on 01/24/13.Pt reports that he cant walk that good and does require the assistance of his cane when he is at home. Pt reports that he last drank 1 pint or more of liquor today. Pt denies current withdraw symptoms and denies hx of seizures or dt's. Pt denies SI,HI, and no AVH.   Reason for Continuation of Hospitalization: Librium taper-withdrawals Medication management Mood  stabilization Estimated length of stay: 2-4 days  For review of initial/current patient goals, please see plan of care.  Attendees:  Patient:    Family:    Physician: Geoffery Lyons MD 01/26/2013 3:15 PM   Nursing: Jan RN 01/26/2013 3:15 PM   Clinical Social Worker [employer], LCSWA  01/26/2013 3:15 PM   Other: Devin Going RN 01/26/2013 3:15 PM   Other: Aggie PA 01/26/2013 3:15 PM   Other:    Other:    Scribe for Treatment Team:  [employer] LCSWA 01/26/2013 3:15 PM

## 2013-01-26 NOTE — Progress Notes (Signed)
[facility] MD Progress Note  01/26/2013 4:34 PM Gabriel Hammond  MRN:  [id number] Subjective:  Bernardini continues to be detox. He is trying to decide where to go from here. Worried about his girlfriend who was admitted due to complications of her diabetes. Does endorses feeling somewhat down, anxious, worried Diagnosis:   DSM5: Schizophrenia Disorders:   Obsessive-Compulsive Disorders:   Trauma-Stressor Disorders:   Substance/Addictive Disorders:  Alcohol Related Disorder - Severe (303.90) Depressive Disorders:    Axis I: Anxiety Disorder NOS and Depressive Disorder NOS  ADL's:  Intact  Sleep: Fair  Appetite:  Poor  Suicidal Ideation:  Plan:  denies Intent:  denies Means:  denies Homicidal Ideation:  Plan:  denies Intent:  denies Means:  denies AEB (as evidenced by):  Psychiatric Specialty Exam: Review of Systems  Constitutional: Negative.   HENT: Negative.   Eyes: Negative.   Respiratory: Negative.   Cardiovascular: Negative.   Gastrointestinal: Negative.   Genitourinary: Negative.   Musculoskeletal: Positive for back pain and joint pain.  Skin: Negative.   Neurological: Negative.   Endo/Heme/Allergies: Negative.   Psychiatric/Behavioral: Positive for depression and substance abuse. The patient is nervous/anxious.     Blood pressure 106/66, pulse 61, temperature 97.5 F (36.4 C), temperature source Oral, resp. rate 16, height 5\' 7"  (1.702 m), weight 64.864 kg (143 lb).Body mass index is 22.39 kg/(m^2).  General Appearance: Disheveled  Eye Contact::  Minimal  Speech:  Clear and Coherent, Slow and not spontaneous  Volume:  Decreased  Mood:  Anxious, Depressed and worried  Affect:  Restricted  Thought Process:  Coherent, Goal Directed and no spontaneous content  Orientation:  Full (Time, Place, and Person)  Thought Content:  worries, concerns  Suicidal Thoughts:  No  Homicidal Thoughts:  No  Memory:  Immediate;   Fair Recent;   Fair Remote;   Fair  Judgement:   Fair  Insight:  Present and Shallow  Psychomotor Activity:  Psychomotor Retardation and Restlessness  Concentration:  Fair  Recall:  Fair  Akathisia:  No  Handed:  Right  AIMS (if indicated):     Assets:  Desire for Improvement  Sleep:  Number of Hours: 6.5   Current Medications: Current Facility-Administered Medications  Medication Dose Route Frequency Provider Last Rate Last Dose  . acetaminophen (TYLENOL) tablet 650 mg  650 mg Oral Q6H PRN Kerry Hough, PA-C   650 mg at 01/25/13 1646  . alum & mag hydroxide-simeth (MAALOX/MYLANTA) 200-200-20 MG/5ML suspension 30 mL  30 mL Oral Q4H PRN Kerry Hough, PA-C      . chlordiazePOXIDE (LIBRIUM) capsule 25 mg  25 mg Oral Q6H PRN Kerry Hough, PA-C      . chlordiazePOXIDE (LIBRIUM) capsule 25 mg  25 mg Oral TID Kerry Hough, PA-C       Followed by  . [START ON 01/27/2013] chlordiazePOXIDE (LIBRIUM) capsule 25 mg  25 mg Oral BH-qamhs Spencer E Simon, PA-C       Followed by  . [START ON 01/29/2013] chlordiazePOXIDE (LIBRIUM) capsule 25 mg  25 mg Oral Daily Kerry Hough, PA-C      . hydrOXYzine (ATARAX/VISTARIL) tablet 25 mg  25 mg Oral Q6H PRN Kerry Hough, PA-C      . loperamide (IMODIUM) capsule 2-4 mg  2-4 mg Oral PRN Kerry Hough, PA-C      . magnesium hydroxide (MILK OF MAGNESIA) suspension 30 mL  30 mL Oral Daily PRN Kerry Hough, PA-C      .  multivitamin with minerals tablet 1 tablet  1 tablet Oral Daily Kerry Hough, PA-C   1 tablet at 01/26/13 0810  . ondansetron (ZOFRAN-ODT) disintegrating tablet 4 mg  4 mg Oral Q6H PRN Kerry Hough, PA-C      . thiamine (B-1) injection 100 mg  100 mg Intramuscular Once [employer], PA-C      . thiamine (VITAMIN B-1) tablet 100 mg  100 mg Oral Daily Kerry Hough, PA-C   100 mg at 01/26/13 0810  . traZODone (DESYREL) tablet 50 mg  50 mg Oral QHS,MR X 1 Kerry Hough, PA-C   50 mg at 01/25/13 2201    Lab Results:  Results for orders placed during the hospital  encounter of 01/24/13 (from the past 48 hour(s))  CBC     Status: Abnormal   Collection Time    01/24/13  5:04 PM      Result Value Range   WBC 4.6  4.0 - 10.5 K/uL   RBC 3.92 (*) 4.22 - 5.81 MIL/uL   Hemoglobin 11.8 (*) 13.0 - 17.0 g/dL   HCT 78.2 (*) 95.6 - 21.3 %   MCV 91.1  78.0 - 100.0 fL   MCH 30.1  26.0 - 34.0 pg   MCHC 33.1  30.0 - 36.0 g/dL   RDW 08.6  57.8 - 46.9 %   Platelets 290  150 - 400 K/uL  BASIC METABOLIC PANEL     Status: None   Collection Time    01/24/13  5:04 PM      Result Value Range   Sodium 143  135 - 145 mEq/L   Potassium 3.7  3.5 - 5.1 mEq/L   Chloride 105  96 - 112 mEq/L   CO2 25  19 - 32 mEq/L   Glucose, Bld 79  70 - 99 mg/dL   BUN 13  6 - 23 mg/dL   Creatinine, Ser 6.29  0.50 - 1.35 mg/dL   Calcium 9.7  8.4 - 52.8 mg/dL   GFR calc non Af Amer >90  >90 mL/min   GFR calc Af Amer >90  >90 mL/min   Comment: (NOTE)     The eGFR has been calculated using the CKD EPI equation.     This calculation has not been validated in all clinical situations.     eGFR's persistently <90 mL/min signify possible Chronic Kidney     Disease.  ETHANOL     Status: Abnormal   Collection Time    01/24/13  5:04 PM      Result Value Range   Alcohol, Ethyl (B) 226 (*) 0 - 11 mg/dL   Comment:            LOWEST DETECTABLE LIMIT FOR     SERUM ALCOHOL IS 11 mg/dL     FOR MEDICAL PURPOSES ONLY    Physical Findings: AIMS: Facial and Oral Movements Muscles of Facial Expression: None, normal Lips and Perioral Area: None, normal Jaw: None, normal Tongue: None, normal,Extremity Movements Upper (arms, wrists, hands, fingers): None, normal Lower (legs, knees, ankles, toes): None, normal, Trunk Movements Neck, shoulders, hips: None, normal, Overall Severity Severity of abnormal movements (highest score from questions above): None, normal Incapacitation due to abnormal movements: None, normal Patient's awareness of abnormal movements (rate only patient's report): No  Awareness, Dental Status Current problems with teeth and/or dentures?: Yes Does patient usually wear dentures?: Yes  CIWA:  CIWA-Ar Total: 4 COWS:     Treatment Plan Summary:  Daily contact with patient to assess and evaluate symptoms and progress in treatment Medication management  Plan: Supportive approach/coping skills/relapse prevention           Continue to assess and address the co morbidities  Medical Decision Making Problem Points:  Review of psycho-social stressors (1) Data Points:  Review of medication regiment & side effects (2) Review of new medications or change in dosage (2)  I certify that inpatient services furnished can reasonably be expected to improve the patient's condition.   Shala Baumbach A 01/26/2013, 4:34 PM

## 2013-01-26 NOTE — BHH Group Notes (Signed)
[facility] LCSW Aftercare Discharge Planning Group Note   01/26/2013 10:33 AM  Participation Quality:  DID NOT ATTEND    Smart, Herbert Seta

## 2013-01-26 NOTE — Progress Notes (Signed)
Recreation Therapy Notes  Date: 08.21.2014 Time: 3:00pm Location: [address] Dayroom  Group Topic: Communication, Team Building, Problem Solving  Goal Area(s) Addresses:  Patient will be able to recognize use of communication, team building and problem solving during course of group activity. Patient will verbalize need for communication, team building and problem solving to make group activity successful.  Patient will verbalize benefit of communication, team building and problem solving to post d/c goals.   Behavioral Response: Did not attend.  Neyah Ellerman L Yeshua Stryker, LRT/CTRS  Arpi Diebold L 01/26/2013 8:03 AM 
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
## 2013-01-26 NOTE — Progress Notes (Signed)
D:Pt has a sad affect with mild anxiety and tremors. He is pleasant and cooperative on the unit. A:Offered support, encouragement and 15 minute checks R:Pt denies si and hi. Safety maintained on the unit.

## 2013-01-26 NOTE — [facility] Group Notes (Signed)
[facility] LCSW Group Therapy  01/26/2013 2:57 PM  Type of Therapy:  Group Therapy  Participation Level:  Minimal  Participation Quality:  Inattentive  Affect:  Appropriate  Cognitive:  Lacking  Insight:  Lacking  Engagement in Therapy:  Lacking  Modes of Intervention:  Discussion, Education, Exploration, Socialization and Support  Summary of Progress/Problems: Feelings around Relapse. Group members discussed the meaning of relapse and shared personal stories of relapse, how it affected them and others, and how they perceived themselves during this time. Group members were encouraged to identify triggers, warning signs and coping skills used when facing the possibility of relapse. Social supports were discussed and explored in detail. Post Acute Withdrawal Syndrome (handout provided) was introduced and examined. Pt's were encouraged to ask questions, talk about key points associated with PAWS, and process this information in terms of relapse prevention. Sensing was inattentive and disengaged throughout group this afternoon. He did follow along as CSW reviewed pamphlet information about PAWS. Gabriel Hammond did not ask questions or actively participate in group conversation. He shows limited progress in the group setting and limited insight AEB his lack of attention and participation while in the group setting.    Smart, Chakira Jachim 01/26/2013, 2:57 PM

## 2013-01-26 NOTE — Progress Notes (Signed)
Adult Psychoeducational Group Note  Date:  01/26/2013 Time:  10:08 PM  Group Topic/Focus: AA Support Group  Participation Level:  Active  Gabriel Hammond 01/26/2013, 10:08 PM 

## 2013-01-26 NOTE — Progress Notes (Signed)
Adult Psychoeducational Group Note  Date:  01/26/2013 Time:  1:49 PM  Group Topic/Focus:  Relapse Prevention Planning:   The focus of this group is to define relapse and discuss the need for planning to combat relapse.  Participation Level:  Did Not Attend  Additional Comments:  Pt remained in his bed during group.  Reinaldo Raddle K 01/26/2013, 1:49 PM

## 2013-01-27 NOTE — [facility] Group Notes (Signed)
[facility] Group Notes:  (Clinical Social Work)  01/27/2013     10-11AM  Summary of Progress/Problems:   The main focus of today's process group was for the patient to identify ways in which they have in the past sabotaged their own recovery. Motivational Interviewing was utilized to ask the group members what they get out of their substance use, and what reasons they may have for wanting to change.  The Stages of Change were explained using a handout, and patients identified where they currently are with regard to stages of change.  The patient expressed that he drinks to stop the shaking in his hands, which is there because of the shaking.  He wants to change because he states his doctor has said he will die if he does not stop drinking.  He is in Relapse, headed for Preparation Stage.  Type of Therapy:  Group Therapy - Process   Participation Level:  Active  Participation Quality:  Attentive  Affect:  Blunted  Cognitive:  Oriented  Insight:  Engaged  Engagement in Therapy:  Engaged  Modes of Intervention:  Education, Teacher, English as a foreign language, Motivational Interviewing  Ambrose Mantle, LCSW 01/27/2013, 11:18 AM

## 2013-01-27 NOTE — Progress Notes (Signed)
Patient ID: Gabriel Hammond, male   DOB: 21-Jun-1950, 62 y.o.   MRN: [id number] D: Client is visible on the unit and is minimally interactive with peers. Affect is anxious; describes mood is depressed; depression 6/10; hopelessness 4/10; appearance is disheveled. Client is guarded during 1:1 interaction. Denies SI/HI. Mild ETOH withdrawal symptoms noted, i.e., tremors in extremities. Continues to be a high fall risk.   A: Continue to encourage group attendance and participation, offer support and assistance in identifying relapse triggers and development of coping skills and encouraged client to share thoughts and feelings to staff. Maintained safety through q 15 minute safety checks by staff.    R: Client is medication compliant and verbalizes safety to staff.

## 2013-01-27 NOTE — Progress Notes (Signed)
[facility] MD Progress Note  01/27/2013 4:59 PM WHITMAN MEINHARDT  MRN:  [id number] Subjective:  Pasquarelli endorses that he is worried about his girlfriend. He is trying to get himself together. Express he is feeling anxious, worried, concerned about relapsing if he was not to get into rehab.  Diagnosis:   DSM5: Schizophrenia Disorders:   Obsessive-Compulsive Disorders:   Trauma-Stressor Disorders:   Substance/Addictive Disorders:  Alcohol Related Disorder - Severe (303.90) Depressive Disorders:    Axis I: Depressive Disorder NOS, Substance Induced Mood Disorder  ADL's:  Intact  Sleep: Fair  Appetite:  Poor  Suicidal Ideation:  Plan:  denies Intent:  denies Means:  denies Homicidal Ideation:  Plan:  denies Intent:  denies Means:  denies AEB (as evidenced by):  Psychiatric Specialty Exam: Review of Systems  HENT: Negative.   Eyes: Negative.   Respiratory: Negative.   Cardiovascular: Negative.   Gastrointestinal: Negative.   Genitourinary: Negative.   Musculoskeletal: Positive for back pain.       Leg pain  Skin: Negative.   Neurological: Positive for weakness.  Psychiatric/Behavioral: Positive for substance abuse. The patient is nervous/anxious.     Blood pressure 112/67, pulse 67, temperature 97.8 F (36.6 C), temperature source Oral, resp. rate 16, height 5\' 7"  (1.702 m), weight 64.864 kg (143 lb).Body mass index is 22.39 kg/(m^2).  General Appearance: Disheveled  Eye Contact::  Minimal  Speech:  Clear and Coherent, Slow and not spontaneous  Volume:  Decreased  Mood:  Anxious and worried  Affect:  Restricted  Thought Process:  Coherent and Goal Directed  Orientation:  Full (Time, Place, and Person)  Thought Content:  worries, concerns, fear of relapsing  Suicidal Thoughts:  No  Homicidal Thoughts:  No  Memory:  Immediate;   Fair Recent;   Fair Remote;   Fair  Judgement:  Fair  Insight:  Present, Suprficial  Psychomotor Activity:  Psychomotor Retardation   Concentration:  Fair  Recall:  Fair  Akathisia:  No  Handed:  Right  AIMS (if indicated):     Assets:  Desire for Improvement  Sleep:  Number of Hours: 6.75   Current Medications: Current Facility-Administered Medications  Medication Dose Route Frequency Provider Last Rate Last Dose  . acetaminophen (TYLENOL) tablet 650 mg  650 mg Oral Q6H PRN Kerry Hough, PA-C   650 mg at 01/27/13 1151  . alum & mag hydroxide-simeth (MAALOX/MYLANTA) 200-200-20 MG/5ML suspension 30 mL  30 mL Oral Q4H PRN Kerry Hough, PA-C      . chlordiazePOXIDE (LIBRIUM) capsule 25 mg  25 mg Oral Q6H PRN Kerry Hough, PA-C      . chlordiazePOXIDE (LIBRIUM) capsule 25 mg  25 mg Oral BH-qamhs Spencer E Simon, PA-C       Followed by  . [START ON 01/29/2013] chlordiazePOXIDE (LIBRIUM) capsule 25 mg  25 mg Oral Daily Kerry Hough, PA-C      . hydrOXYzine (ATARAX/VISTARIL) tablet 25 mg  25 mg Oral Q6H PRN Kerry Hough, PA-C      . loperamide (IMODIUM) capsule 2-4 mg  2-4 mg Oral PRN Kerry Hough, PA-C      . magnesium hydroxide (MILK OF MAGNESIA) suspension 30 mL  30 mL Oral Daily PRN Kerry Hough, PA-C      . multivitamin with minerals tablet 1 tablet  1 tablet Oral Daily Kerry Hough, PA-C   1 tablet at 01/27/13 0815  . ondansetron (ZOFRAN-ODT) disintegrating tablet 4 mg  4 mg Oral  Q6H PRN Kerry Hough, PA-C      . thiamine (B-1) injection 100 mg  100 mg Intramuscular Once [employer], PA-C      . thiamine (VITAMIN B-1) tablet 100 mg  100 mg Oral Daily Kerry Hough, PA-C   100 mg at 01/27/13 0815  . traZODone (DESYREL) tablet 50 mg  50 mg Oral QHS,MR X 1 Kerry Hough, PA-C   50 mg at 01/26/13 2120    Lab Results: No results found for this or any previous visit (from the past 48 hour(s)).  Physical Findings: AIMS: Facial and Oral Movements Muscles of Facial Expression: None, normal Lips and Perioral Area: None, normal Jaw: None, normal Tongue: None, normal,Extremity  Movements Upper (arms, wrists, hands, fingers): None, normal Lower (legs, knees, ankles, toes): None, normal, Trunk Movements Neck, shoulders, hips: None, normal, Overall Severity Severity of abnormal movements (highest score from questions above): None, normal Incapacitation due to abnormal movements: None, normal Patient's awareness of abnormal movements (rate only patient's report): No Awareness, Dental Status Current problems with teeth and/or dentures?: Yes Does patient usually wear dentures?: Yes  CIWA:  CIWA-Ar Total: 2 COWS:     Treatment Plan Summary: Daily contact with patient to assess and evaluate symptoms and progress in treatment Medication management  Plan: Supportive approach/coping skills/relapse prevention           Complete the detox  Medical Decision Making Problem Points:  Review of psycho-social stressors (1) Data Points:  Review of medication regiment & side effects (2)  I certify that inpatient services furnished can reasonably be expected to improve the patient's condition.   Chelesa Weingartner A 01/27/2013, 4:59 PM

## 2013-01-27 NOTE — Progress Notes (Signed)
Adult Psychoeducational Group Note  Date:  01/27/2013 Time:  0900   Group Topic/Focus:  Self Inventory  Participation Level:  Did Not Attend  Pt decided not to attend group  Gabriel Hammond 01/27/2013, 10:59 AM

## 2013-01-27 NOTE — [facility] Group Notes (Signed)
[facility] Group Notes:  (Nursing/MHT/Case Management/Adjunct)  Date:  01/27/2013  Time:  2:49 PM  Type of Therapy:  Nurse Education  Participation Level:  Minimal  Participation Quality:  Drowsy  Affect:  Lethargic  Cognitive:  Lacking  Insight:  None  Engagement in Group:  None  Modes of Intervention:  Problem-solving  Summary of Progress/Problems:  Gabriel Hammond 01/27/2013, 2:49 PM

## 2013-01-27 NOTE — Progress Notes (Signed)
Patient did attend the evening speaker AA meeting.  
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
## 2013-01-28 NOTE — Progress Notes (Signed)
D: Client resting in bed with eyes closed; unlabored breathing noted.   A: Q 15 minute safety checks completed.   R: Safety maintained.  

## 2013-01-28 NOTE — Progress Notes (Signed)
Adult Psychoeducational Group Note  Date:  01/28/2013 Time:  0900  Group Topic/Focus:  Self Inventory  Participation Level:  Minimal  Participation Quality:  Attentive and Sharing  Affect:  Anxious and Appropriate  Cognitive:  Appropriate  Insight: Improving  Engagement in Group:  Engaged  Modes of Intervention:  Discussion  Additional Comments:  Pt expresses a desire to join AA after discharge.  Ming Kunka Shari Prows 01/28/2013, 10:30 AM

## 2013-01-28 NOTE — [facility] Group Notes (Signed)
[facility] Group Notes:  (Clinical Social Work)  01/28/2013  10:00-11:00AM  Summary of Progress/Problems:   The main focus of today's process group was to   identify the patient's current support system and decide on other supports that can be put in place.  The picture on workbook was used to discuss why additional supports are needed, and a hand-out was distributed with four definitions/levels of support, then used to talk about how patients have given and received all different kinds of support.  An emphasis was placed on using counselor, doctor, therapy groups, 12-step groups, and problem-specific support groups to expand supports.  The patient identified one additional support as being going to Alcoholics Anonymous, getting a therapist, and reaching out to his caseworker.  He stated he already has family members who are supportive.,  Type of Therapy:  Process Group with Motivational Interviewing  Participation Level:  Active  Participation Quality:  Attentive  Affect:  Blunted  Cognitive:  Alert, Appropriate and Oriented  Insight:  Engaged  Engagement in Therapy:  Engaged  Modes of Intervention:   Education, Support and Processing, Activity  [employer], LCSW 01/28/2013, 12:53 PM

## 2013-01-28 NOTE — Progress Notes (Signed)
Patient did attend the evening speaker AA meeting.  

## 2013-01-28 NOTE — Progress Notes (Signed)
[facility] MD Progress Note  01/28/2013 3:36 PM Gabriel Hammond  MRN:  [id number] Subjective:  Bun is still having a hard time. States he needs the help once he is out of here to be able to stay sober. He has not heard from his girlfriend what is worrisome (she is inpatient due to complications of her Diabetes) He has a case worker who is trying to help him otherwise he does not have anybody else.  Diagnosis:   DSM5: Schizophrenia Disorders:   Obsessive-Compulsive Disorders:   Trauma-Stressor Disorders:   Substance/Addictive Disorders:  Alcohol Related Disorder - Severe (303.90) Depressive Disorders:    Axis I: Depressive Disorder NOS  ADL's:  Intact  Sleep: Fair  Appetite:  Fair  Suicidal Ideation:  Plan:  denies Intent:  denies Means:  denies Homicidal Ideation:  Plan:  denies Intent:  denies Means:  denies AEB (as evidenced by):  Psychiatric Specialty Exam: Review of Systems  HENT: Negative.   Eyes: Negative.   Respiratory: Negative.   Cardiovascular: Negative.   Gastrointestinal: Negative.   Genitourinary: Negative.   Musculoskeletal: Positive for back pain.       Leg pain  Skin: Negative.   Neurological: Positive for weakness.  Endo/Heme/Allergies: Negative.   Psychiatric/Behavioral: Positive for substance abuse. The patient is nervous/anxious.     Blood pressure 107/68, pulse 76, temperature 98 F (36.7 C), temperature source Oral, resp. rate 20, height 5\' 7"  (1.702 m), weight 64.864 kg (143 lb).Body mass index is 22.39 kg/(m^2).  General Appearance: Disheveled  Eye Contact::  Minimal  Speech:  Clear and Coherent, Slow and not spontaneous  Volume:  Decreased  Mood:  Anxious and Depressed  Affect:  Restricted  Thought Process:  Coherent and Goal Directed  Orientation:  Full (Time, Place, and Person)  Thought Content:  not spontaneous content, worries, concerns  Suicidal Thoughts:  No  Homicidal Thoughts:  No  Memory:  Immediate;   Fair  Judgement:  Fair   Insight:  Present and Shallow  Psychomotor Activity:  Psychomotor Retardation  Concentration:  Fair  Recall:  Fair  Akathisia:  No  Handed:  Right  AIMS (if indicated):     Assets:  Desire for Improvement  Sleep:  Number of Hours: 5.25   Current Medications: Current Facility-Administered Medications  Medication Dose Route Frequency Provider Last Rate Last Dose  . acetaminophen (TYLENOL) tablet 650 mg  650 mg Oral Q6H PRN Kerry Hough, PA-C   650 mg at 01/27/13 1151  . alum & mag hydroxide-simeth (MAALOX/MYLANTA) 200-200-20 MG/5ML suspension 30 mL  30 mL Oral Q4H PRN Kerry Hough, PA-C      . [START ON 01/29/2013] chlordiazePOXIDE (LIBRIUM) capsule 25 mg  25 mg Oral Daily Spencer E Simon, PA-C      . magnesium hydroxide (MILK OF MAGNESIA) suspension 30 mL  30 mL Oral Daily PRN Kerry Hough, PA-C      . multivitamin with minerals tablet 1 tablet  1 tablet Oral Daily Kerry Hough, PA-C   1 tablet at 01/28/13 [phone]  . thiamine (B-1) injection 100 mg  100 mg Intramuscular Once [employer], PA-C      . thiamine (VITAMIN B-1) tablet 100 mg  100 mg Oral Daily Kerry Hough, PA-C   100 mg at 01/28/13 0815  . traZODone (DESYREL) tablet 50 mg  50 mg Oral QHS,MR X 1 Kerry Hough, PA-C   50 mg at 01/27/13 2136    Lab Results: No results found  for this or any previous visit (from the past 48 hour(s)).  Physical Findings: AIMS: Facial and Oral Movements Muscles of Facial Expression: None, normal Lips and Perioral Area: None, normal Jaw: None, normal Tongue: None, normal,Extremity Movements Upper (arms, wrists, hands, fingers): None, normal Lower (legs, knees, ankles, toes): None, normal, Trunk Movements Neck, shoulders, hips: None, normal, Overall Severity Severity of abnormal movements (highest score from questions above): None, normal Incapacitation due to abnormal movements: None, normal Patient's awareness of abnormal movements (rate only patient's report): No Awareness,  Dental Status Current problems with teeth and/or dentures?: Yes Does patient usually wear dentures?: Yes  CIWA:  CIWA-Ar Total: 3 COWS:     Treatment Plan Summary: Daily contact with patient to assess and evaluate symptoms and progress in treatment Medication management  Plan: Supportive approach/coping skills/relapse prevention           Reassess the mood symptoms            Complete the Detox/identify possible placement options  Medical Decision Making Problem Points:  Review of psycho-social stressors (1) Data Points:  Review of new medications or change in dosage (2)  I certify that inpatient services furnished can reasonably be expected to improve the patient's condition.   Yanai Hobson A 01/28/2013, 3:36 PM

## 2013-01-28 NOTE — Progress Notes (Signed)
Patient ID: Gabriel Hammond, male   DOB: 03/20/1951, 62 y.o.   MRN: [id number] D: Pt is awake and active on the unit this AM. Pt denies SI/HI and A/V hallucinations. Pt rates their depression at 2 and hopelessness at 2. Pt's most recent CIWA score was 2. Pt mood is depressed and his affect is blunted. Pt writes that he wants to "take better care of myself, stop drinking and visit my MD." Pt insight is improving and he expressed a desire to get involved with AA after d/c. He is active in the milieu and participates during groups.   A: Encouraged pt to discuss feelings with staff and administered medication per MD orders. Writer also encouraged pt to participate in groups.  R: Pt is attending groups and tolerating medications well. Writer will continue to monitor. 15 minute checks are ongoing for safety.

## 2013-01-28 NOTE — Progress Notes (Signed)
Psychoeducational Group Note  Date:  01/28/2013 Time:  0945 am  Group Topic/Focus:  Making Healthy Choices:   The focus of this group is to help patients identify negative/unhealthy choices they were using prior to admission and identify positive/healthier coping strategies to replace them upon discharge.  Participation Level:  Active  Participation Quality:  Appropriate  Affect:  Appropriate  Cognitive:  Appropriate  Insight:  Developing/Improving  Engagement in Group:  Developing/Improving  Additional Comments:    Andrena Mews 01/28/2013, 10:29 AM

## 2013-01-29 DIAGNOSIS — F10239 Alcohol dependence with withdrawal, unspecified: Principal | ICD-10-CM

## 2013-01-29 MED ORDER — ASPIRIN EC 81 MG PO TBEC: 81.0000 mg | DELAYED_RELEASE_TABLET | ORAL | Status: DC | PRN

## 2013-01-29 MED ORDER — ACETAMINOPHEN 500 MG PO TABS
500.0000 mg | ORAL_TABLET | Freq: Four times a day (QID) | ORAL | Status: DC | PRN
Start: 2013-01-29 — End: 2015-01-27

## 2013-01-29 MED ORDER — TRAZODONE HCL 50 MG PO TABS: 50.0000 mg | ORAL_TABLET | Freq: Every evening | ORAL | Status: DC | PRN

## 2013-01-29 NOTE — Discharge Summary (Signed)
Physician Discharge Summary Note  Patient:  Gabriel Hammond is an 62 y.o., male MRN:  [id number] DOB:  02/02/1951 Patient phone:  [phone] (home)  Patient address:   [address]  [address] [place] [address],   Date of Admission:  01/24/2013 Date of Discharge: 01/29/13  Reason for Admission:  Alcohol detox  Discharge Diagnoses: Active Problems:   Alcohol dependence  Review of Systems  Constitutional: Negative.   HENT: Negative.   Eyes: Negative.   Respiratory: Negative.   Cardiovascular: Negative.   Gastrointestinal: Negative.   Genitourinary: Negative.   Musculoskeletal: Negative.   Skin: Negative.   Neurological: Negative.   Endo/Heme/Allergies: Negative.   Psychiatric/Behavioral: Positive for substance abuse (Alcoholism). Negative for depression, suicidal ideas, hallucinations and memory loss. The patient is nervous/anxious and has insomnia (Stabilized with medication prior to discharged).     DSM5:  Schizophrenia Disorders:  NA Obsessive-Compulsive Disorders:  NA Trauma-Stressor Disorders:  NA Substance/Addictive Disorders: Alcohol withdrawal Depressive Disorders:  NA  Axis Diagnosis:   AXIS I:  Alcohol withdrawal AXIS II:  Deferred AXIS III:   Past Medical History  Diagnosis Date  . Hypertension   . Hypercholesterolemia   . Mental disorder    AXIS IV:  Alcohol dependence AXIS V:  63  Level of Care:  OP  Hospital Course:  62 Y/O male who states he is drinking and he is trying to stop. Drinks a Veterinary surgeon.Has been drinkig since he was 17. States that he does not have any extended period of sobriety. He started drinking more after his mother died a year ago. He has extensive family history of alcoholism. His brother died of complications from drinking. He is in a committed realtionship with a girlfriend who is currently In the hospital due to her Diabetes. States he felt this was a good time to stop.   Upon admission into this hospital, and  after admission assessment/evaluation coupled with UDS/Toxicology reports, it was determined that Gabriel Hammond blood alcohol levels was 226. It was then decided that he will need detoxification treatment protocol to stabilize his systems of alcohol intoxication and to combat the withdrawal symptoms of alcohol as well.  Gabriel Hammond was then started on Librium treatment protocol. He was also enrolled in group counseling sessions and activities where he was counseled, taught and learned coping skills that should help him after discharge to cope better and manage his substance abuse issues to sustain a much longer sobriety. He also attended AA/NA meetings being offered and held on this unit. Gabriel Hammond has some previously existing and or identifiable medical conditions that required treatment and or monitoring. He received medication management for all those health issues as well. He was monitored closely for any potential problems that may arise as a result of and or during detoxification treatment. Patient tolerated his treatment regimen and detoxification treatment protocol without any significant adverse effects and or reactions presented.  Patient attended treatment team meeting this am and met with the treatment team members. His reason for admission, present symptoms, substance abuse issues, response to treatment and discharge plans discussed. Patient endorsed that he is doing well and stable for discharge to pursue the next phase of his substance abuse treatment. It was then agreed upon that he will follow-up care at the [facility] clinic in [place], [place] on 01/31/13 between the hours of 07:45 and 11:00 am. Gabriel Hammond has been instructed and informed that this is a walk-in appointment as well. Besides the detoxification treatments received, Gabriel Hammond also  was ordered and received Trazodone 50 Q bedtime for sleep.  Upon discharge, patient adamantly denies suicidal, homicidal ideations, auditory, visual  hallucinations, delusional thougts and or withdrawal symptoms. Patient left [facility] with all personal belongings in no apparent distress. He received 2 weeks worth supply samples of his [facility] discharge medications provided by [facility] pharmacy. Transportation per family.   Consults:  psychiatry  Significant Diagnostic Studies:  labs: CBC with diff, CMP, UDS, Toxicology tests, U/A  Discharge Vitals:   Blood pressure 112/71, pulse 71, temperature 96.5 F (35.8 C), temperature source Oral, resp. rate 24, height 5\' 7"  (1.702 m), weight 64.864 kg (143 lb). Body mass index is 22.39 kg/(m^2). Lab Results:   No results found for this or any previous visit (from the past 72 hour(s)).  Physical Findings: AIMS: Facial and Oral Movements Muscles of Facial Expression: None, normal Lips and Perioral Area: None, normal Jaw: None, normal Tongue: None, normal,Extremity Movements Upper (arms, wrists, hands, fingers): None, normal Lower (legs, knees, ankles, toes): None, normal, Trunk Movements Neck, shoulders, hips: None, normal, Overall Severity Severity of abnormal movements (highest score from questions above): None, normal Incapacitation due to abnormal movements: None, normal Patient's awareness of abnormal movements (rate only patient's report): No Awareness, Dental Status Current problems with teeth and/or dentures?: Yes Does patient usually wear dentures?: Yes  CIWA:  CIWA-Ar Total: 3 COWS:     Psychiatric Specialty Exam: See Psychiatric Specialty Exam and Suicide Risk Assessment completed by Attending Physician prior to discharge.  Discharge destination:  Home  Is patient on multiple antipsychotic therapies at discharge:  No   Has Patient had three or more failed trials of antipsychotic monotherapy by history:  No  Recommended Plan for Multiple Antipsychotic Therapies: NA      Discharge Orders   Future Appointments Provider Department Dept Phone   04/09/2013 10:00 AM Nira Retort, NP  [facility] [phone]   Future Orders Complete By Expires   Discharge instructions  As directed    Comments:     Take all your medications as prescribed by your mental healthcare provider. Report any adverse effects and or reactions from your medicines to your outpatient provider promptly. Patient is instructed and cautioned to not engage in alcohol and or illegal drug use while on prescription medicines. In the event of worsening symptoms, patient is instructed to call the crisis hotline, 911 and or go to the nearest ED for appropriate evaluation and treatment of symptoms. Follow-up with your primary care provider for your other medical issues, concerns and or health care needs.       Medication List    STOP taking these medications       naproxen 500 MG tablet  Commonly known as:  NAPROSYN      TAKE these medications     Indication   acetaminophen 500 MG tablet  Commonly known as:  TYLENOL  Take 1 tablet (500 mg total) by mouth every 6 (six) hours as needed for pain.   Indication:  Pain     aspirin EC 81 MG tablet  Take 1 tablet (81 mg total) by mouth as needed for pain. (Heart health)   Indication:  Heart helth     traZODone 50 MG tablet  Commonly known as:  DESYREL  Take 1 tablet (50 mg total) by mouth at bedtime and may repeat dose one time if needed. For sleep   Indication:  Trouble Sleeping       Follow-up Information   Follow up  with Laurena Spies On 01/31/2013. (Walk in between 7:45AM-11AM for hospital followup. Referral #: [id number])    Contact information:   [address] [place], [place] [address] phone: [phone] fax: [phone]     Follow-up recommendations: Activity:  As tolerated Diet: As recommended by your primary care doctor. Keep all scheduled follow-up appointments as recommended.   Comments:  Take all your medications as prescribed by your mental healthcare provider. Report any adverse effects and or reactions from your  medicines to your outpatient provider promptly. Patient is instructed and cautioned to not engage in alcohol and or illegal drug use while on prescription medicines. In the event of worsening symptoms, patient is instructed to call the crisis hotline, 911 and or go to the nearest ED for appropriate evaluation and treatment of symptoms. Follow-up with your primary care provider for your other medical issues, concerns and or health care needs.  Total Discharge Time:  Greater than 30 minutes.  SignedSanjuana Kava, PMHNP-BC 01/30/2013, 2:19 PM

## 2013-01-29 NOTE — Clinical Social Work Note (Signed)
CSW spoke with pt's case manager, Jaquelyn Bitter. She will pick up pt later this afternoon. CM will call back when she is on her way. [phone])

## 2013-01-29 NOTE — Progress Notes (Signed)
Patient ID: Gabriel Hammond, male   DOB: [birth year], 62 y.o.   MRN: [id number]  Patient has been discharged home to follow up with [facility] in [place]. Patient has been given his discharge instructions and has expressed understanding via teach back. Patient's belongings have been returned and all paperwork has been signed. Prescriptions and sample medications were given to patient who was picked up by his case manager.

## 2013-01-29 NOTE — Progress Notes (Signed)
Patient currently denies any withdrawal symptoms, denies any suicidality, homicidality, or auditory or visual hallucinations. Patient does complain of chronic arthritic  knee pain that is usually relieved by tylenol or ibuprofen; patient has refused medication at this time. Patient plans to attend AA meetings regularly upon discharge and states that he has two sister in [place], [place] that are good support system.

## 2013-01-29 NOTE — BHH Group Notes (Signed)
[facility] LCSW Aftercare Discharge Planning Group Note   01/29/2013 9:39 AM  Participation Quality:  Appropriate   Mood/Affect:  Appropriate  Depression Rating:  0  Anxiety Rating:  5  Thoughts of Suicide:  No Will you contract for safety?   NA  Current AVH:  No  Plan for Discharge/Comments:  Pt stated that he plans to followup at [facility] and is interested in [facility] IOP program offered. No withdrawals reported. "I slept well all weekend."   Transportation Means: Ms June Nolon Bussing (case worker)= [phone]  Supports: case worker/some family members.   Smart, [employer]

## 2013-01-29 NOTE — BHH Suicide Risk Assessment (Signed)
Suicide Risk Assessment  Discharge Assessment     Demographic Factors:  Male, Low socioeconomic status and Unemployed  Mental Status Per Nursing Assessment::   On Admission:  NA  Current Mental Status by Physician: patient denies suicidal ideations, intent or plan  Loss Factors: Decrease in vocational status and Financial problems/change in socioeconomic status  Historical Factors: Impulsivity  Risk Reduction Factors:   Sense of responsibility to family, Living with another person, especially a relative and Positive social support  Continued Clinical Symptoms:  Alcohol/Substance Abuse/Dependencies  Cognitive Features That Contribute To Risk:  Closed-mindedness    Suicide Risk:  Minimal: No identifiable suicidal ideation.  Patients presenting with no risk factors but with morbid ruminations; may be classified as minimal risk based on the severity of the depressive symptoms  Discharge Diagnoses:   AXIS I:  Alcohol Dependence AXIS II:  Deferred AXIS III:   Past Medical History  Diagnosis Date  . Hypertension   . Hypercholesterolemia    AXIS IV:  economic problems, other psychosocial or environmental problems and problems related to social environment AXIS V:  61-70 mild symptoms  Plan Of Care/Follow-up recommendations:  Activity:  as tolerated Diet:  healthy  Tests:  routine Other:  Patient to keep his after care appointment  Is patient on multiple antipsychotic therapies at discharge:  No   Has Patient had three or more failed trials of antipsychotic monotherapy by history:  No  Recommended Plan for Multiple Antipsychotic Therapies: N/A  Xanthe Couillard,MD 01/29/2013, 11:09 AM

## 2013-01-29 NOTE — Progress Notes (Signed)
[facility] Adult Case Management Discharge Plan :  Will you be returning to the same living situation after discharge: Yes,  home  At discharge, do you have transportation home?:Yes,  CM should be coming to pick up pt this afternoon. Do you have the ability to pay for your medications:Yes,  Medicaid  Release of information consent forms completed and in the chart;  Patient's signature needed at discharge.  Patient to Follow up at: Follow-up Information   Follow up with Laurena Spies On 01/31/2013. (Walk in between 7:45AM-11AM for hospital followup. Referral #: [id number])    Contact information:   [address] [place], [place] [address] phone: [phone] fax: [phone]      Patient denies SI/HI:   Yes,  during admission/group/self report    Safety Planning and Suicide Prevention discussed:  Yes,  SPE not required for this pt as he did not endorse SI during stay or during admission.   Smart, Mineola Duan 01/29/2013, 11:44 AM

## 2013-01-29 NOTE — Progress Notes (Signed)
D: Client resting in bed with eyes closed; unlabored breathing noted.   A: Q 15 minute safety checks completed.   R: Safety maintained.  

## 2013-02-02 NOTE — Discharge Summary (Signed)
Seen and agreed. Corderius Saraceni, MD 

## 2013-02-02 NOTE — Progress Notes (Signed)
Patient Discharge Instructions:  After Visit Summary (AVS):   Faxed to:  02/02/13 Discharge Summary Note:   Faxed to:  02/02/13 Psychiatric Admission Assessment Note:   Faxed to:  02/02/13 Suicide Risk Assessment - Discharge Assessment:   Faxed to:  02/02/13 Faxed/Sent to the Next Level Care provider:  02/02/13 Faxed to [facility] @ [phone]  Jerelene Redden, 02/02/2013, 2:44 PM

## 2013-02-26 ENCOUNTER — Other Ambulatory Visit: Payer: Self-pay

## 2013-02-26 DIAGNOSIS — D649 Anemia, unspecified: Secondary | ICD-10-CM

## 2013-02-26 DIAGNOSIS — R7989 Other specified abnormal findings of blood chemistry: Secondary | ICD-10-CM

## 2013-03-01 ENCOUNTER — Other Ambulatory Visit: Payer: Self-pay | Admitting: Gastroenterology

## 2013-03-01 LAB — HEPATIC FUNCTION PANEL
ALT: 33 U/L (ref 0–53)
AST: 32 U/L (ref 0–37)
Albumin: 4.7 g/dL (ref 3.5–5.2)
Alkaline Phosphatase: 74 U/L (ref 39–117)
Bilirubin, Direct: 0.1 mg/dL (ref 0.0–0.3)
Indirect Bilirubin: 0.2 mg/dL (ref 0.0–0.9)
Total Bilirubin: 0.3 mg/dL (ref 0.3–1.2)
Total Protein: 7.4 g/dL (ref 6.0–8.3)

## 2013-03-05 LAB — FERRITIN: Ferritin: 883 ng/mL — ABNORMAL HIGH (ref 22–322)

## 2013-03-05 NOTE — Progress Notes (Signed)
Pt already has an OV scheduled for 11/3 at 10 with [facility] for a 3 month follow up. Do I still need to bring patient in sooner? Please advise.

## 2013-03-05 NOTE — Progress Notes (Signed)
Quick Note:  Normal LFTs.  Did he have ferritin done as well? Need that on file.  OV non-urgent in next few weeks. ______

## 2013-03-06 ENCOUNTER — Ambulatory Visit (INDEPENDENT_AMBULATORY_CARE_PROVIDER_SITE_OTHER): Payer: Medicaid Other | Admitting: Family Medicine

## 2013-03-06 ENCOUNTER — Encounter: Payer: Self-pay | Admitting: Family Medicine

## 2013-03-06 VITALS — BP 138/92 | Temp 97.9°F | Wt 151.0 lb

## 2013-03-06 DIAGNOSIS — F1020 Alcohol dependence, uncomplicated: Secondary | ICD-10-CM
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
## 2013-03-07 DIAGNOSIS — F1020 Alcohol dependence, uncomplicated: Secondary | ICD-10-CM | POA: Insufficient documentation

## 2013-03-12 NOTE — Progress Notes (Signed)
Quick Note:  Ferritin still elevated but down from 3 months ago.  Looks like he attempted detox from ETOH. Will be interesting to see what happens with recheck of ferritin in next few months if he continues to abstain.  His iron and transferrin levels are normal, so I doubt we are dealing with hemochromatosis. Elevated ferritin likely secondary to hx of ETOH abuse. Keep appt upcoming in Nov. ______

## 2013-04-09 ENCOUNTER — Encounter: Payer: Self-pay | Admitting: Gastroenterology

## 2013-04-09 ENCOUNTER — Ambulatory Visit (INDEPENDENT_AMBULATORY_CARE_PROVIDER_SITE_OTHER): Payer: Medicaid Other | Admitting: Gastroenterology

## 2013-04-09 ENCOUNTER — Encounter (INDEPENDENT_AMBULATORY_CARE_PROVIDER_SITE_OTHER): Payer: Self-pay

## 2013-04-09 VITALS — BP 138/78 | HR 83 | Temp 98.2°F | Wt 148.4 lb

## 2013-04-09 DIAGNOSIS — K760 Fatty (change of) liver, not elsewhere classified: Secondary | ICD-10-CM | POA: Insufficient documentation

## 2013-04-09 DIAGNOSIS — K7689 Other specified diseases of liver: Secondary | ICD-10-CM

## 2013-04-09 DIAGNOSIS — D649 Anemia, unspecified: Secondary | ICD-10-CM

## 2013-04-09 DIAGNOSIS — R7989 Other specified abnormal findings of blood chemistry: Secondary | ICD-10-CM

## 2013-04-09 NOTE — Progress Notes (Signed)
cc'd to pcp 

## 2013-04-09 NOTE — Assessment & Plan Note (Signed)
In setting of ETOH.

## 2013-04-09 NOTE — Patient Instructions (Signed)
Try to eat 3 meals a day. It is important that you cut back and quit alcohol. This will damage your liver over time.   I would like to have you get blood work done in December. We are rechecking your blood count and iron levels. We will see you again in January.   Have a wonderful Thanksgiving, birthday, and Christmas!

## 2013-04-09 NOTE — Assessment & Plan Note (Signed)
Normocytic. No signs of GI etiology currently. Colonoscopy up-to-date with need for surveillance in March 2019. Iron normal with elevated ferritin, which is in the setting of ETOH abuse. No upper GI symptoms such as dysphagia, abdominal pain, reflux. He does note occasional early satiety, but this is quite vague in report. Weight has fluctuated from 148 to high of 153 since Feb 2014. I discussed the possibility of proceeding with an EGD in the future if he notes any significant change in appetite, further weight loss, or evidence of early IDA. In the setting of ETOH abuse, I question vague early satiety related to this. However, unable to exclude occult malignancy but less likely.   Will redraw CBC and iron in mid December.  Return in Jan 2015.  Low-threshold for EGD.
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
## 2013-04-09 NOTE — Assessment & Plan Note (Signed)
On recent US of abdomen Aug 2014. LFTs normal. Recheck periodically.

## 2013-04-11 ENCOUNTER — Other Ambulatory Visit: Payer: Self-pay | Admitting: Gastroenterology

## 2013-04-11 DIAGNOSIS — D649 Anemia, unspecified: Secondary | ICD-10-CM

## 2013-04-30 ENCOUNTER — Other Ambulatory Visit: Payer: Self-pay

## 2013-04-30 DIAGNOSIS — D649 Anemia, unspecified: Secondary | ICD-10-CM

## 2013-05-07 LAB — CBC WITH DIFFERENTIAL/PLATELET
Basophils Absolute: 0 10*3/uL (ref 0.0–0.1)
Basophils Relative: 0 % (ref 0–1)
Eosinophils Absolute: 0.1 10*3/uL (ref 0.0–0.7)
Eosinophils Relative: 2 % (ref 0–5)
HCT: 38 % — ABNORMAL LOW (ref 39.0–52.0)
Hemoglobin: 12.9 g/dL — ABNORMAL LOW (ref 13.0–17.0)
Lymphocytes Relative: 40 % (ref 12–46)
Lymphs Abs: 1.5 10*3/uL (ref 0.7–4.0)
MCH: 28.6 pg (ref 26.0–34.0)
MCHC: 33.9 g/dL (ref 30.0–36.0)
MCV: 84.3 fL (ref 78.0–100.0)
Monocytes Absolute: 0.3 10*3/uL (ref 0.1–1.0)
Monocytes Relative: 9 % (ref 3–12)
Neutro Abs: 1.9 10*3/uL (ref 1.7–7.7)
Neutrophils Relative %: 49 % (ref 43–77)
Platelets: 348 10*3/uL (ref 150–400)
RBC: 4.51 MIL/uL (ref 4.22–5.81)
RDW: 16.6 % — ABNORMAL HIGH (ref 11.5–15.5)
WBC: 3.8 10*3/uL — ABNORMAL LOW (ref 4.0–10.5)

## 2013-05-07 LAB — IRON: Iron: 117 ug/dL (ref 42–165)

## 2013-05-15 NOTE — Progress Notes (Signed)
Quick Note:  Hgb better. Iron improved.  Likely anemia of chronic disease.  Keep appt for January. ______

## 2013-06-13 ENCOUNTER — Encounter (INDEPENDENT_AMBULATORY_CARE_PROVIDER_SITE_OTHER): Payer: Self-pay

## 2013-06-13 ENCOUNTER — Encounter: Payer: Self-pay | Admitting: Gastroenterology

## 2013-06-13 ENCOUNTER — Ambulatory Visit (INDEPENDENT_AMBULATORY_CARE_PROVIDER_SITE_OTHER): Payer: Medicaid Other | Admitting: Gastroenterology

## 2013-06-13 VITALS — BP 143/72 | HR 104 | Temp 97.6°F | Wt 151.2 lb

## 2013-06-13 DIAGNOSIS — R634 Abnormal weight loss: Secondary | ICD-10-CM | POA: Insufficient documentation

## 2013-06-13 DIAGNOSIS — K7689 Other specified diseases of liver: Secondary | ICD-10-CM

## 2013-06-13 DIAGNOSIS — K760 Fatty (change of) liver, not elsewhere classified: Secondary | ICD-10-CM

## 2013-06-13 NOTE — Patient Instructions (Signed)
Continue to use the protein shakes twice a day.   We will see you in 3 months!

## 2013-06-13 NOTE — Assessment & Plan Note (Addendum)
Multifactorial in setting of ETOH use. Continues to work towards abstinence. Applauded on efforts. Weight improved with addition of boost. No concerning upper or lower GI symptoms. Colonoscopy up-to-date and due again in 2019. Return in 3 months.

## 2013-06-13 NOTE — Assessment & Plan Note (Signed)
Normal LFTs on file. Check serially.
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
## 2013-06-14 NOTE — Progress Notes (Signed)
cc'd to pcp 

## 2013-09-11 ENCOUNTER — Ambulatory Visit (INDEPENDENT_AMBULATORY_CARE_PROVIDER_SITE_OTHER): Payer: Medicaid Other | Admitting: Gastroenterology

## 2013-09-11 ENCOUNTER — Encounter (INDEPENDENT_AMBULATORY_CARE_PROVIDER_SITE_OTHER): Payer: Self-pay

## 2013-09-11 ENCOUNTER — Encounter: Payer: Self-pay | Admitting: Gastroenterology

## 2013-09-11 VITALS — BP 121/70 | HR 101 | Temp 97.3°F | Ht 65.0 in | Wt 155.4 lb

## 2013-09-11 DIAGNOSIS — K760 Fatty (change of) liver, not elsewhere classified: Secondary | ICD-10-CM

## 2013-09-11 DIAGNOSIS — K7689 Other specified diseases of liver: Secondary | ICD-10-CM

## 2013-09-11 DIAGNOSIS — R634 Abnormal weight loss: Secondary | ICD-10-CM

## 2013-09-11 NOTE — Assessment & Plan Note (Signed)
Recheck HFP now. Strongly encouraged to abstain from ETOH. Improved weight, good appetite, no concerning lower or upper GI symptoms. Return in 6 months for weight check. Next surveillance colonoscopy in 2019.

## 2013-09-11 NOTE — Progress Notes (Signed)
cc'd to pcp 

## 2013-09-11 NOTE — Assessment & Plan Note (Signed)
Previously in setting of stress, grief (girlfriend at different facility now). Much improved now. Continue protein shakes for supplementation. Return in 6 months or sooner if needed.

## 2013-09-11 NOTE — Patient Instructions (Signed)
Continue to use the protein shakes twice a day.   We will see you in 6 months or sooner if needed!! Please have blood work drawn today.
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
## 2013-09-12 LAB — HEPATIC FUNCTION PANEL
ALT: 11 U/L (ref 0–53)
AST: 17 U/L (ref 0–37)
Albumin: 4.8 g/dL (ref 3.5–5.2)
Alkaline Phosphatase: 57 U/L (ref 39–117)
Bilirubin, Direct: 0.1 mg/dL (ref 0.0–0.3)
Indirect Bilirubin: 0.3 mg/dL (ref 0.2–1.2)
Total Bilirubin: 0.4 mg/dL (ref 0.2–1.2)
Total Protein: 7.6 g/dL (ref 6.0–8.3)

## 2013-09-12 NOTE — Progress Notes (Signed)
Quick Note:  Normal LFTs.  Follow-up as planned. ______

## 2014-01-08 ENCOUNTER — Telehealth: Payer: Self-pay | Admitting: Orthopedic Surgery

## 2014-01-10 ENCOUNTER — Other Ambulatory Visit: Payer: Self-pay | Admitting: Orthopedic Surgery

## 2014-01-10 ENCOUNTER — Ambulatory Visit (HOSPITAL_COMMUNITY)
Admission: RE | Admit: 2014-01-10 | Discharge: 2014-01-10 | Disposition: A | Discharge status: Home / Self Care | Payer: Medicaid Other | Source: Ambulatory Visit | Attending: Orthopedic Surgery | Admitting: Orthopedic Surgery

## 2014-01-10 DIAGNOSIS — M545 Low back pain, unspecified: Secondary | ICD-10-CM | POA: Diagnosis present

## 2014-01-10 DIAGNOSIS — M25552 Pain in left hip: Secondary | ICD-10-CM

## 2014-01-10 DIAGNOSIS — M169 Osteoarthritis of hip, unspecified: Secondary | ICD-10-CM | POA: Diagnosis not present

## 2014-01-10 DIAGNOSIS — M1610 Unilateral primary osteoarthritis, unspecified hip: Secondary | ICD-10-CM | POA: Diagnosis not present

## 2014-01-10 DIAGNOSIS — M955 Acquired deformity of pelvis: Secondary | ICD-10-CM | POA: Diagnosis not present

## 2014-01-10 DIAGNOSIS — M25551 Pain in right hip: Secondary | ICD-10-CM

## 2014-01-29 ENCOUNTER — Ambulatory Visit (INDEPENDENT_AMBULATORY_CARE_PROVIDER_SITE_OTHER): Payer: Medicaid Other | Admitting: Orthopedic Surgery

## 2014-01-29 ENCOUNTER — Encounter: Payer: Self-pay | Admitting: Orthopedic Surgery

## 2014-01-29 DIAGNOSIS — S72141S Displaced intertrochanteric fracture of right femur, sequela: Secondary | ICD-10-CM

## 2014-01-29 DIAGNOSIS — M1610 Unilateral primary osteoarthritis, unspecified hip: Secondary | ICD-10-CM

## 2014-01-29 DIAGNOSIS — M160 Bilateral primary osteoarthritis of hip: Secondary | ICD-10-CM

## 2014-01-29 DIAGNOSIS — S72009S Fracture of unspecified part of neck of unspecified femur, sequela: Secondary | ICD-10-CM

## 2014-01-29 MED ORDER — TRAMADOL-ACETAMINOPHEN 37.5-325 MG PO TABS: 1.0000 | ORAL_TABLET | ORAL | Status: DC | PRN
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
## 2014-02-13 ENCOUNTER — Encounter: Payer: Self-pay | Admitting: Internal Medicine

## 2014-04-09 ENCOUNTER — Ambulatory Visit (INDEPENDENT_AMBULATORY_CARE_PROVIDER_SITE_OTHER): Payer: Medicaid Other | Admitting: Gastroenterology

## 2014-04-09 ENCOUNTER — Encounter: Payer: Self-pay | Admitting: Gastroenterology

## 2014-04-09 VITALS — BP 110/67 | HR 99 | Temp 98.4°F | Ht 68.0 in | Wt 159.0 lb

## 2014-04-09 DIAGNOSIS — R634 Abnormal weight loss: Secondary | ICD-10-CM

## 2014-04-09 DIAGNOSIS — K760 Fatty (change of) liver, not elsewhere classified: Secondary | ICD-10-CM

## 2014-04-09 NOTE — Assessment & Plan Note (Signed)
Continue to recommend ETOH abstinence. Recheck in April 2016.

## 2014-04-09 NOTE — Patient Instructions (Signed)
Please stop drinking alcohol.   We will see you back in 6 months!

## 2014-04-09 NOTE — Assessment & Plan Note (Signed)
Improved/Resolved. Weight up several pounds from last visit. Likely multifactorial. Return in 6 months.
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
## 2014-04-09 NOTE — Progress Notes (Signed)
cc'ed to pcp °

## 2014-04-16 NOTE — Telephone Encounter (Signed)
Error discard

## 2014-11-11 ENCOUNTER — Ambulatory Visit (HOSPITAL_COMMUNITY)
Admission: RE | Admit: 2014-11-11 | Discharge: 2014-11-11 | Disposition: A | Discharge status: Home / Self Care | Payer: Medicaid Other | Source: Ambulatory Visit | Attending: Nurse Practitioner | Admitting: Nurse Practitioner

## 2014-11-11 DIAGNOSIS — R000 Tachycardia, unspecified: Secondary | ICD-10-CM | POA: Diagnosis not present

## 2015-01-08 ENCOUNTER — Ambulatory Visit: Payer: Self-pay | Admitting: Cardiology

## 2015-01-16 ENCOUNTER — Ambulatory Visit: Payer: Self-pay | Admitting: Cardiology

## 2015-01-27 ENCOUNTER — Encounter: Payer: Self-pay | Admitting: *Deleted

## 2015-01-27 ENCOUNTER — Other Ambulatory Visit: Payer: Self-pay | Admitting: Cardiology

## 2015-01-27 ENCOUNTER — Ambulatory Visit (INDEPENDENT_AMBULATORY_CARE_PROVIDER_SITE_OTHER): Payer: Medicaid Other | Admitting: Cardiology

## 2015-01-27 ENCOUNTER — Encounter: Payer: Self-pay | Admitting: Cardiology

## 2015-01-27 ENCOUNTER — Telehealth: Payer: Self-pay | Admitting: Cardiology

## 2015-01-27 VITALS — BP 110/73 | HR 87 | Ht 68.0 in | Wt 144.8 lb

## 2015-01-27 DIAGNOSIS — I10 Essential (primary) hypertension: Secondary | ICD-10-CM | POA: Diagnosis not present

## 2015-01-27 DIAGNOSIS — R9431 Abnormal electrocardiogram [ECG] [EKG]: Secondary | ICD-10-CM

## 2015-01-27 DIAGNOSIS — I5022 Chronic systolic (congestive) heart failure: Secondary | ICD-10-CM

## 2015-01-27 MED ORDER — HYDROCHLOROTHIAZIDE 12.5 MG PO TABS: ORAL_TABLET | ORAL | Status: DC

## 2015-01-27 MED ORDER — CARVEDILOL 3.125 MG PO TABS: 3.1250 mg | ORAL_TABLET | Freq: Two times a day (BID) | ORAL | Status: DC

## 2015-01-27 NOTE — Patient Instructions (Addendum)
Your physician recommends that you schedule a follow-up appointment in: AFTER CATH  Your physician has recommended you make the following change in your medication:   START COREG 3.125 MG [place] physician has requested that you have a cardiac catheterization. Cardiac catheterization is used to diagnose and/or treat various heart conditions. Doctors may recommend this procedure for a number of different reasons. The most common reason is to evaluate chest pain. Chest pain can be a symptom of coronary artery disease (CAD), and cardiac catheterization can show whether plaque is narrowing or blocking your heart's arteries. This procedure is also used to evaluate the valves, as well as measure the blood flow and oxygen levels in different parts of your heart. For further information please visit [email or web address]. Please follow instruction sheet, as given.  WE WILL REQUEST LABS FROM PCP  Thank you for choosing [facility]!!

## 2015-01-27 NOTE — Addendum Note (Signed)
Addended by: Julian Hy T on: 01/27/2015 09:34 AM   Modules accepted: Orders
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
## 2015-01-27 NOTE — Telephone Encounter (Signed)
[facility] 8/26 @ 12pm with [place] Checking percert

## 2015-01-27 NOTE — Telephone Encounter (Signed)
No precert required 

## 2015-01-31 ENCOUNTER — Observation Stay (HOSPITAL_COMMUNITY)
Admission: RE | Admit: 2015-01-31 | Discharge: 2015-02-01 | Disposition: A | Discharge status: Home / Self Care | Payer: Medicaid Other | Source: Ambulatory Visit | Attending: Cardiovascular Disease | Admitting: Cardiovascular Disease

## 2015-01-31 ENCOUNTER — Encounter (HOSPITAL_COMMUNITY)
Admission: RE | Disposition: A | Discharge status: Home / Self Care | Payer: Self-pay | Source: Ambulatory Visit | Attending: Cardiovascular Disease

## 2015-01-31 DIAGNOSIS — Z7982 Long term (current) use of aspirin: Secondary | ICD-10-CM | POA: Diagnosis not present

## 2015-01-31 DIAGNOSIS — R079 Chest pain, unspecified: Secondary | ICD-10-CM | POA: Diagnosis present

## 2015-01-31 DIAGNOSIS — I10 Essential (primary) hypertension: Secondary | ICD-10-CM | POA: Diagnosis not present

## 2015-01-31 DIAGNOSIS — Z87891 Personal history of nicotine dependence: Secondary | ICD-10-CM | POA: Diagnosis not present

## 2015-01-31 DIAGNOSIS — I2510 Atherosclerotic heart disease of native coronary artery without angina pectoris: Secondary | ICD-10-CM | POA: Diagnosis not present

## 2015-01-31 DIAGNOSIS — I428 Other cardiomyopathies: Secondary | ICD-10-CM | POA: Diagnosis present

## 2015-01-31 DIAGNOSIS — I429 Cardiomyopathy, unspecified: Secondary | ICD-10-CM | POA: Diagnosis not present

## 2015-01-31 DIAGNOSIS — E780 Pure hypercholesterolemia: Secondary | ICD-10-CM | POA: Insufficient documentation

## 2015-01-31 DIAGNOSIS — Z79899 Other long term (current) drug therapy: Secondary | ICD-10-CM | POA: Diagnosis not present

## 2015-01-31 DIAGNOSIS — I5022 Chronic systolic (congestive) heart failure: Secondary | ICD-10-CM | POA: Diagnosis not present

## 2015-01-31 DIAGNOSIS — Z791 Long term (current) use of non-steroidal anti-inflammatories (NSAID): Secondary | ICD-10-CM | POA: Diagnosis not present

## 2015-01-31 HISTORY — PX: CARDIAC CATHETERIZATION: SHX172

## 2015-01-31 LAB — BASIC METABOLIC PANEL
Anion gap: 9 (ref 5–15)
BUN: 10 mg/dL (ref 6–20)
CO2: 25 mmol/L (ref 22–32)
Calcium: 9.4 mg/dL (ref 8.9–10.3)
Chloride: 104 mmol/L (ref 101–111)
Creatinine, Ser: 0.71 mg/dL (ref 0.61–1.24)
GFR calc Af Amer: >60 mL/min (ref >60)
GFR calc non Af Amer: >60 mL/min (ref >60)
Glucose, Bld: 121 mg/dL — ABNORMAL HIGH (ref 65–99)
Potassium: 3.9 mmol/L (ref 3.5–5.1)
Sodium: 138 mmol/L (ref 135–145)

## 2015-01-31 LAB — POCT I-STAT 3, ART BLOOD GAS (G3+)
Acid-base deficit: 1 mmol/L (ref 0.0–2.0)
Bicarbonate: 24 mEq/L (ref 20.0–24.0)
O2 Saturation: 95 %
TCO2: 25 mmol/L (ref 0–100)
pCO2 arterial: 40.2 mmHg (ref 35.0–45.0)
pH, Arterial: 7.384 (ref 7.350–7.450)
pO2, Arterial: 75 mmHg — ABNORMAL LOW (ref 80.0–100.0)

## 2015-01-31 LAB — PROTIME-INR
INR: 1.14 (ref 0.00–1.49)
INR: 1.23 (ref 0.00–1.49)
Prothrombin Time: 14.8 seconds (ref 11.6–15.2)
Prothrombin Time: 15.7 seconds — ABNORMAL HIGH (ref 11.6–15.2)

## 2015-01-31 LAB — CBC
HCT: 39.4 % (ref 39.0–52.0)
Hemoglobin: 12.7 g/dL — ABNORMAL LOW (ref 13.0–17.0)
MCH: 28.8 pg (ref 26.0–34.0)
MCHC: 32.2 g/dL (ref 30.0–36.0)
MCV: 89.3 fL (ref 78.0–100.0)
Platelets: 342 10*3/uL (ref 150–400)
RBC: 4.41 MIL/uL (ref 4.22–5.81)
RDW: 16 % — ABNORMAL HIGH (ref 11.5–15.5)
WBC: 3.1 10*3/uL — ABNORMAL LOW (ref 4.0–10.5)

## 2015-01-31 LAB — POCT ACTIVATED CLOTTING TIME: Activated Clotting Time: 165 seconds

## 2015-01-31 LAB — POCT I-STAT 3, VENOUS BLOOD GAS (G3P V)
Bicarbonate: 25.3 mEq/L — ABNORMAL HIGH (ref 20.0–24.0)
O2 Saturation: 58 %
TCO2: 27 mmol/L (ref 0–100)
pCO2, Ven: 44.2 mmHg — ABNORMAL LOW (ref 45.0–50.0)
pH, Ven: 7.365 — ABNORMAL HIGH (ref 7.250–7.300)
pO2, Ven: 31 mmHg (ref 30.0–45.0)

## 2015-01-31 SURGERY — RIGHT/LEFT HEART CATH AND CORONARY ANGIOGRAPHY

## 2015-01-31 MED ORDER — HEPARIN SODIUM (PORCINE) 1000 UNIT/ML IJ SOLN
INTRAMUSCULAR | Status: DC | PRN
  Administered 2015-01-31: 3500 [IU] via INTRAVENOUS

## 2015-01-31 MED ORDER — HEPARIN (PORCINE) IN NACL 2-0.9 UNIT/ML-% IJ SOLN
INTRAMUSCULAR | Status: AC
  Filled 2015-01-31: qty 1500

## 2015-01-31 MED ORDER — TRAZODONE HCL 100 MG PO TABS
100.0000 mg | ORAL_TABLET | Freq: Every evening | ORAL | Status: DC | PRN
  Administered 2015-01-31: 100 mg via ORAL
  Filled 2015-01-31: qty 1

## 2015-01-31 MED ORDER — HEPARIN SODIUM (PORCINE) 5000 UNIT/ML IJ SOLN
5000.0000 [IU] | Freq: Three times a day (TID) | INTRAMUSCULAR | Status: DC
Start: 2015-01-31 — End: 2015-02-01
  Administered 2015-01-31 – 2015-02-01 (×2): 5000 [IU] via SUBCUTANEOUS
  Filled 2015-01-31 (×2): qty 1

## 2015-01-31 MED ORDER — CARVEDILOL 3.125 MG PO TABS: 3.1250 mg | ORAL_TABLET | Freq: Two times a day (BID) | ORAL | Status: DC

## 2015-01-31 MED ORDER — VERAPAMIL HCL 2.5 MG/ML IV SOLN
INTRAVENOUS | Status: AC
  Filled 2015-01-31: qty 2

## 2015-01-31 MED ORDER — SODIUM CHLORIDE 0.9 % IJ SOLN: 3.0000 mL | Freq: Two times a day (BID) | INTRAMUSCULAR | Status: DC

## 2015-01-31 MED ORDER — ACETAMINOPHEN 325 MG PO TABS: 650.0000 mg | ORAL_TABLET | ORAL | Status: DC | PRN

## 2015-01-31 MED ORDER — SODIUM CHLORIDE 0.9 % IJ SOLN: 3.0000 mL | INTRAMUSCULAR | Status: DC | PRN

## 2015-01-31 MED ORDER — MIDAZOLAM HCL 2 MG/2ML IJ SOLN
INTRAMUSCULAR | Status: AC
  Filled 2015-01-31: qty 4

## 2015-01-31 MED ORDER — VITAMIN D (ERGOCALCIFEROL) 1.25 MG (50000 UNIT) PO CAPS: 50000.0000 [IU] | ORAL_CAPSULE | ORAL | Status: DC

## 2015-01-31 MED ORDER — LIDOCAINE HCL (PF) 1 % IJ SOLN
INTRAMUSCULAR | Status: DC | PRN
Start: 2015-01-31 — End: 2015-01-31
  Administered 2015-01-31: 2 mL

## 2015-01-31 MED ORDER — VERAPAMIL HCL 2.5 MG/ML IV SOLN
INTRAVENOUS | Status: DC | PRN
  Administered 2015-01-31: 13:00:00 via INTRA_ARTERIAL

## 2015-01-31 MED ORDER — ASPIRIN 300 MG RE SUPP: 300.0000 mg | RECTAL | Status: DC

## 2015-01-31 MED ORDER — FENTANYL CITRATE (PF) 100 MCG/2ML IJ SOLN
INTRAMUSCULAR | Status: DC | PRN
  Administered 2015-01-31: 25 ug via INTRAVENOUS

## 2015-01-31 MED ORDER — SODIUM CHLORIDE 0.9 % IV SOLN: INTRAVENOUS | Status: AC

## 2015-01-31 MED ORDER — SODIUM CHLORIDE 0.9 % IV SOLN: 250.0000 mL | INTRAVENOUS | Status: DC | PRN

## 2015-01-31 MED ORDER — SODIUM CHLORIDE 0.9 % IJ SOLN
3.0000 mL | Freq: Two times a day (BID) | INTRAMUSCULAR | Status: DC
  Administered 2015-01-31: 3 mL via INTRAVENOUS

## 2015-01-31 MED ORDER — ASPIRIN 81 MG PO CHEW: 324.0000 mg | CHEWABLE_TABLET | ORAL | Status: DC

## 2015-01-31 MED ORDER — ASPIRIN 81 MG PO CHEW
CHEWABLE_TABLET | ORAL | Status: AC
  Filled 2015-01-31: qty 1

## 2015-01-31 MED ORDER — FENTANYL CITRATE (PF) 100 MCG/2ML IJ SOLN
INTRAMUSCULAR | Status: AC
Start: 2015-01-31 — End: 2015-01-31
  Filled 2015-01-31: qty 4

## 2015-01-31 MED ORDER — ACETAMINOPHEN 500 MG PO TABS: 500.0000 mg | ORAL_TABLET | Freq: Three times a day (TID) | ORAL | Status: DC | PRN

## 2015-01-31 MED ORDER — SODIUM CHLORIDE 0.9 % IJ SOLN
3.0000 mL | INTRAMUSCULAR | Status: DC | PRN
Start: 2015-01-31 — End: 2015-02-01

## 2015-01-31 MED ORDER — SODIUM CHLORIDE 0.9 % IV SOLN
INTRAVENOUS | Status: DC
  Administered 2015-01-31: 10:00:00 via INTRAVENOUS

## 2015-01-31 MED ORDER — SODIUM CHLORIDE 0.9 % IJ SOLN
3.0000 mL | Freq: Two times a day (BID) | INTRAMUSCULAR | Status: DC
  Administered 2015-02-01: 3 mL via INTRAVENOUS

## 2015-01-31 MED ORDER — MIDAZOLAM HCL 2 MG/2ML IJ SOLN
INTRAMUSCULAR | Status: DC | PRN
  Administered 2015-01-31: 1 mg via INTRAVENOUS

## 2015-01-31 MED ORDER — LIDOCAINE HCL (PF) 1 % IJ SOLN
INTRAMUSCULAR | Status: AC
  Filled 2015-01-31: qty 30

## 2015-01-31 MED ORDER — IOHEXOL 350 MG/ML SOLN
INTRAVENOUS | Status: DC | PRN
  Administered 2015-01-31: 100 mL via INTRAVENOUS

## 2015-01-31 MED ORDER — HEPARIN SODIUM (PORCINE) 1000 UNIT/ML IJ SOLN
INTRAMUSCULAR | Status: AC
  Filled 2015-01-31: qty 1

## 2015-01-31 MED ORDER — TRAMADOL-ACETAMINOPHEN 37.5-325 MG PO TABS
1.0000 | ORAL_TABLET | ORAL | Status: DC | PRN
  Administered 2015-01-31: 1 via ORAL
  Filled 2015-01-31: qty 1

## 2015-01-31 MED ORDER — HYDROCHLOROTHIAZIDE 12.5 MG PO CAPS
12.5000 mg | ORAL_CAPSULE | Freq: Every day | ORAL | Status: DC
  Administered 2015-02-01: 12.5 mg via ORAL
  Filled 2015-01-31: qty 1

## 2015-01-31 MED ORDER — ONDANSETRON HCL 4 MG/2ML IJ SOLN: 4.0000 mg | Freq: Four times a day (QID) | INTRAMUSCULAR | Status: DC | PRN

## 2015-01-31 MED ORDER — ASPIRIN 81 MG PO CHEW
81.0000 mg | CHEWABLE_TABLET | ORAL | Status: AC
Start: 2015-01-31 — End: 2015-01-31
  Administered 2015-01-31: 81 mg via ORAL

## 2015-01-31 MED ORDER — HYDROCHLOROTHIAZIDE 25 MG PO TABS: 12.5000 mg | ORAL_TABLET | Freq: Every day | ORAL | Status: DC

## 2015-01-31 MED ORDER — LIDOCAINE HCL (PF) 1 % IJ SOLN
INTRAMUSCULAR | Status: DC | PRN
  Administered 2015-01-31: 13:00:00

## 2015-01-31 SURGICAL SUPPLY — 16 items
CATH BALLN WEDGE 5F 110CM (CATHETERS) ×3 IMPLANT
CATH INFINITI 5 FR JL3.5 (CATHETERS) ×3 IMPLANT
CATH INFINITI 5FR ANG PIGTAIL (CATHETERS) ×3 IMPLANT
CATH INFINITI JR4 5F (CATHETERS) ×3 IMPLANT
CATH SWAN GANZ 7F STRAIGHT (CATHETERS) ×3 IMPLANT
DEVICE RAD COMP TR BAND LRG (VASCULAR PRODUCTS) ×3 IMPLANT
GLIDESHEATH SLEND SS 6F .021 (SHEATH) ×3 IMPLANT
GUIDEWIRE .025 260CM (WIRE) ×3 IMPLANT
KIT HEART LEFT (KITS) ×3 IMPLANT
PACK CARDIAC CATHETERIZATION (CUSTOM PROCEDURE TRAY) ×3 IMPLANT
SHEATH FAST CATH BRACH 5F 5CM (SHEATH) ×3 IMPLANT
SHEATH PINNACLE 7F 10CM (SHEATH) ×3 IMPLANT
SYR MEDRAD MARK V 150ML (SYRINGE) ×3 IMPLANT
TRANSDUCER W/STOPCOCK (MISCELLANEOUS) ×3 IMPLANT
TUBING CIL FLEX 10 FLL-RA (TUBING) ×3 IMPLANT
WIRE SAFE-T 1.5MM-J .035X260CM (WIRE) ×3 IMPLANT
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
## 2015-01-31 NOTE — Progress Notes (Addendum)
Transferred to 3w21. Report called to Alaina,R.N. Pt transported with stable VS. Alert and Oriented. Pt kept informed and understands reason for transfer.

## 2015-01-31 NOTE — Progress Notes (Signed)
Pt states his social worker is driving him home today but he has nobody to stay with him tonight.  Social worker has verified this.  Reported to Dr. Angelena Form who will admit overnight,  Pt's social worker Foye Spurling  is arranging transportation home for tomorrow.

## 2015-01-31 NOTE — Interval H&P Note (Signed)
History and Physical Interval Note:  01/31/2015 10:17 AM  Bowden T Liew  has presented today for cardiac cath with the diagnosis of cardiomyopathy. The various methods of treatment have been discussed with the patient and family. After consideration of risks, benefits and other options for treatment, the patient has consented to  Procedure(s): Right/Left Heart Cath and Coronary Angiography (N/A) as a surgical intervention .  [name] history has been reviewed, patient examined, no change in status, stable for surgery.  I have reviewed the patient's chart and labs.  Questions were answered to the patient's satisfaction.  \  Cath Lab Visit (complete for each Cath Lab visit)  Clinical Evaluation Leading to the Procedure:   ACS: No.  Non-ACS:    Anginal Classification: No Symptoms  Anti-ischemic medical therapy: Minimal Therapy (1 class of medications)  Non-Invasive Test Results: No non-invasive testing performed  Prior CABG: No previous CABG         Elford Evilsizer

## 2015-01-31 NOTE — Progress Notes (Signed)
Transportation home has been arranged by pts Education officer, [employer].  Transportation has been arranged for 1300 by [place] . The number is [phone].

## 2015-01-31 NOTE — Progress Notes (Signed)
Order for sheath removal verified per post procedural orders. Procedure explained to patient and Rt femoral artery access site assessed: level 0, palpable dorsalis pedis and posterior tibial pulses. 7 [place] Sheath removed FROM RIGHT FEM VEIN and manual pressure applied for 10 minutes. Pre, peri, & post procedural vitals: HR 80, RR 16, O2 Sat upper 90'S , BP 117/85, Pain 0. Distal pulses remained intact after sheath removal. Access site level 0 and dressed with 4X4 gauze and tegaderm. Luan Moore , RN confirmed condition of site. Post procedural instructions discussed and return demonstration from patient.

## 2015-02-01 DIAGNOSIS — I5022 Chronic systolic (congestive) heart failure: Secondary | ICD-10-CM

## 2015-02-01 DIAGNOSIS — I429 Cardiomyopathy, unspecified: Secondary | ICD-10-CM | POA: Diagnosis not present

## 2015-02-01 DIAGNOSIS — I428 Other cardiomyopathies: Secondary | ICD-10-CM | POA: Diagnosis not present

## 2015-02-01 LAB — CBC
HCT: 35.9 % — ABNORMAL LOW (ref 39.0–52.0)
Hemoglobin: 11.4 g/dL — ABNORMAL LOW (ref 13.0–17.0)
MCH: 28.7 pg (ref 26.0–34.0)
MCHC: 31.8 g/dL (ref 30.0–36.0)
MCV: 90.4 fL (ref 78.0–100.0)
Platelets: 278 10*3/uL (ref 150–400)
RBC: 3.97 MIL/uL — ABNORMAL LOW (ref 4.22–5.81)
RDW: 16.3 % — ABNORMAL HIGH (ref 11.5–15.5)
WBC: 2.9 10*3/uL — ABNORMAL LOW (ref 4.0–10.5)

## 2015-02-01 LAB — COMPREHENSIVE METABOLIC PANEL
ALT: 31 U/L (ref 17–63)
AST: 19 U/L (ref 15–41)
Albumin: 3.3 g/dL — ABNORMAL LOW (ref 3.5–5.0)
Alkaline Phosphatase: 52 U/L (ref 38–126)
Anion gap: 6 (ref 5–15)
BUN: 10 mg/dL (ref 6–20)
CO2: 25 mmol/L (ref 22–32)
Calcium: 9.2 mg/dL (ref 8.9–10.3)
Chloride: 107 mmol/L (ref 101–111)
Creatinine, Ser: 0.76 mg/dL (ref 0.61–1.24)
GFR calc Af Amer: >60 mL/min (ref >60)
GFR calc non Af Amer: >60 mL/min (ref >60)
Glucose, Bld: 117 mg/dL — ABNORMAL HIGH (ref 65–99)
Potassium: 4.1 mmol/L (ref 3.5–5.1)
Sodium: 138 mmol/L (ref 135–145)
Total Bilirubin: 0.5 mg/dL (ref 0.3–1.2)
Total Protein: 6.1 g/dL — ABNORMAL LOW (ref 6.5–8.1)

## 2015-02-01 LAB — LIPID PANEL
Cholesterol: 165 mg/dL (ref 0–200)
HDL: 49 mg/dL (ref >40)
LDL Cholesterol: 78 mg/dL (ref 0–99)
Total CHOL/HDL Ratio: 3.4 RATIO
Triglycerides: 188 mg/dL — ABNORMAL HIGH (ref <150)
VLDL: 38 mg/dL (ref 0–40)

## 2015-02-01 MED ORDER — ASPIRIN EC 81 MG PO TBEC: 81.0000 mg | DELAYED_RELEASE_TABLET | Freq: Every day | ORAL | Status: DC

## 2015-02-01 MED ORDER — ATORVASTATIN CALCIUM 20 MG PO TABS: 20.0000 mg | ORAL_TABLET | Freq: Every day | ORAL | Status: DC

## 2015-02-01 NOTE — Discharge Summary (Signed)
Discharge Summary   Patient ID: Gabriel Hammond,  MRN: [id number], DOB/AGE: Apr 19, 1951 64 y.o.  Admit date: 01/31/2015 Discharge date: 02/01/2015  Primary Care Provider: [facility] Primary Cardiologist: Dr. Harl Bowie  Discharge Diagnoses Principal Problem:   Non-ischemic cardiomyopathy Active Problems:   Chest pain   Chronic systolic heart failure   Allergies No Known Allergies  Procedures  Cardiac catheterization Conclusion     Prox RCA lesion, 20% stenosed.  Mid RCA lesion, 20% stenosed.  Ramus lesion, 30% stenosed.  Prox LAD lesion, 20% stenosed.  1. Mild non-obstructive CAD 2. Non-ischemic cardiomyopathy (LVEF10-15% by echo. No LV gram performed today).  3. Elevated pressures as above  Recommendations: Medical management of CAD and NICM. Follow up with Dr. Harl Bowie in 2-3 weeks.    Eyes     Hospital Course  Gabriel Hammond is a 64 year old male with chronic hypertension and did not take medication for years and also questionable history of EtOH abuse who was recently found to have severely low EF in June 2016 by his PCP. Echocardiogram obtained in June showed EF 10-15%, diffuse hypokinesis, restrictive diastolic dysfunction. Interestingly, patient denies any shortness of breath or dyspnea on exertion and regularly ride bicycles without any trouble. As part of ischemic workup, patient was referred to Dr. Harl Bowie. After discussing various options with the patient, he agreed to undergo diagnostic cardiac catheterization. He presented to [facility]/2015 for the planned procedure which showed nonobstructive CAD consistent with nonischemic cardiomyopathy.   He was seen on the following morning on 02/01/2015, at which time he denies any significant chest discomfort or shortness breath. Physical exam shows a split S2 noted the left upper sternal border, however patient appears to be euvolemic. He is currently on Coreg and HCTZ and  not on any Lasix. He is status deemed stable for discharge from cardiology perspective. I will arrange 2-3 weeks follow-up with Dr. Nelly Laurence office.   Of note, during this admission, patient's white blood cell count has been persistently low despite having no fever or any sign of infection. His white blood cell count on 8/27 was 2.9. Patient has been instructed to follow-up with his primary care physician for further workup. Once his blood pressure improves, we can potentially uptitrate his beta blocker and at ACE inhibitor to help improve his LV function. Since his triglyceride is 188, he was placed on a low dose lipitor.   Discharge Vitals Blood pressure 119/78, pulse 79, temperature 97.6 F (36.4 C), temperature source Oral, resp. rate 19, height 5\' 8"  (1.727 m), weight 143 lb 11.2 oz (65.182 kg), SpO2 97 %.  Filed Weights   01/31/15 0954 01/31/15 1828 02/01/15 0623  Weight: 144 lb (65.318 kg) 141 lb 9.6 oz (64.229 kg) 143 lb 11.2 oz (65.182 kg)    Labs  CBC  Recent Labs  01/31/15 1023 02/01/15 0436  WBC 3.1* 2.9*  HGB 12.7* 11.4*  HCT 39.4 35.9*  MCV 89.3 90.4  PLT 342 865   Basic Metabolic Panel  Recent Labs  01/31/15 1023 02/01/15 0436  NA 138 138  K 3.9 4.1  CL 104 107  CO2 25 25  GLUCOSE 121* 117*  BUN 10 10  CREATININE 0.71 0.76  CALCIUM 9.4 9.2   Liver Function Tests  Recent Labs  02/01/15 0436  AST 19  ALT 31  ALKPHOS 52  BILITOT 0.5  PROT 6.1*  ALBUMIN 3.3*   Fasting Lipid Panel  Recent Labs  02/01/15 0436  CHOL  165  HDL 49  LDLCALC 78  TRIG 188*  CHOLHDL 3.4    Disposition  Pt is being discharged home today in good condition.  Follow-up Plans & Appointments      Follow-up Information    Follow up with Carlyle Dolly, MD.   Specialty:  Cardiology   Why:  Office will contact you to arrange followup with Dr. Harl Bowie. Please give [place] a call if you do not hear from [place] in 2 business days   Contact information:   [address] [place] [address] [phone]       Please follow up.   Why:  Please followup with your primary care provider regarding low white blood cell count      Discharge Medications    Medication List    TAKE these medications        acetaminophen 500 MG tablet  Commonly known as:  TYLENOL  Take 500 mg by mouth every 8 (eight) hours as needed for mild pain or moderate pain.     aspirin EC 81 MG tablet  Take 1 tablet (81 mg total) by mouth daily.     atorvastatin 20 MG tablet  Commonly known as:  LIPITOR  Take 1 tablet (20 mg total) by mouth daily.     carvedilol 3.125 MG tablet  Commonly known as:  COREG  Take 1 tablet (3.125 mg total) by mouth 2 (two) times daily.     diclofenac sodium 1 % Gel  Commonly known as:  VOLTAREN  Apply 4 g topically 4 (four) times daily.     hydrochlorothiazide 12.5 MG tablet  Commonly known as:  HYDRODIURIL  Take 1 tab daily as needed for swelling     traMADol-acetaminophen 37.5-325 MG per tablet  Commonly known as:  ULTRACET  Take 1 tablet by mouth every 4 (four) hours as needed.     traZODone 50 MG tablet  Commonly known as:  DESYREL  Take 100 mg by mouth at bedtime and may repeat dose one time if needed. For sleep     Vitamin D (Ergocalciferol) 50000 UNITS Caps capsule  Commonly known as:  DRISDOL  Take 50,000 Units by mouth every 7 (seven) days. Tuesday         Duration of Discharge Encounter   Greater than 30 minutes including physician time.  Hilbert Corrigan PA-C Pager: [phone] 02/01/2015, 12:40 PM

## 2015-02-01 NOTE — Progress Notes (Signed)
Patient Name: Gabriel Hammond Date of Encounter: 02/01/2015  Primary Cardiologist: Dr. Harl Bowie   Principal Problem:   Non-ischemic cardiomyopathy Active Problems:   Chest pain    SUBJECTIVE  Denies any CP or SOB. Feeling good  CURRENT MEDS . aspirin  324 mg Oral NOW   Or  . aspirin  300 mg Rectal NOW  . carvedilol  3.125 mg Oral BID WC  . heparin  5,000 Units Subcutaneous 3 times per day  . hydrochlorothiazide  12.5 mg Oral Daily  . sodium chloride  3 mL Intravenous Q12H  . sodium chloride  3 mL Intravenous Q12H  . sodium chloride  3 mL Intravenous Q12H  . traZODone  100 mg Oral QHS,MR X 1  . [START ON 02/04/2015] Vitamin D (Ergocalciferol)  50,000 Units Oral Q7 days    OBJECTIVE  Filed Vitals:   01/31/15 2038 02/01/15 0444 02/01/15 0623 02/01/15 1007  BP: 110/72 103/73  119/78  Pulse: 87 79    Temp: 98.4 F (36.9 C) 97.6 F (36.4 C)    TempSrc: Oral Oral    Resp:      Height:      Weight:   143 lb 11.2 oz (65.182 kg)   SpO2: 100% 97%      Intake/Output Summary (Last 24 hours) at 02/01/15 1132 Last data filed at 02/01/15 7482  Gross per 24 hour  Intake      0 ml  Output    400 ml  Net   -400 ml   Filed Weights   01/31/15 0954 01/31/15 1828 02/01/15 0623  Weight: 144 lb (65.318 kg) 141 lb 9.6 oz (64.229 kg) 143 lb 11.2 oz (65.182 kg)    PHYSICAL EXAM  General: Pleasant, NAD. Neuro: Alert and oriented X 3. Moves all extremities spontaneously. Psych: Normal affect. HEENT:  Normal  Neck: Supple without bruits or JVD. Lungs:  Resp regular and unlabored, CTA. Heart: RRR no s4, or murmurs. +Split S2 at LUSB Abdomen: Soft, non-tender, non-distended, BS + x 4.  R radial and R femoral cath site stable.  Extremities: No clubbing, cyanosis or edema. DP/PT/Radials 2+ and equal bilaterally.  Accessory Clinical Findings  CBC  Recent Labs  01/31/15 1023 02/01/15 0436  WBC 3.1* 2.9*  HGB 12.7* 11.4*  HCT 39.4 35.9*  MCV 89.3 90.4  PLT 342 707    Basic Metabolic Panel  Recent Labs  01/31/15 1023 02/01/15 0436  NA 138 138  K 3.9 4.1  CL 104 107  CO2 25 25  GLUCOSE 121* 117*  BUN 10 10  CREATININE 0.71 0.76  CALCIUM 9.4 9.2   Liver Function Tests  Recent Labs  02/01/15 0436  AST 19  ALT 31  ALKPHOS 52  BILITOT 0.5  PROT 6.1*  ALBUMIN 3.3*   Fasting Lipid Panel  Recent Labs  02/01/15 0436  CHOL 165  HDL 49  LDLCALC 78  TRIG 188*  CHOLHDL 3.4    TELE NSR    ECG  No new EKG  Echocardiogram 11/11/2014  LV EF: 10% -  15%  ------------------------------------------------------------------- Indications:   Tachycardia 785.0.  ------------------------------------------------------------------- Study Conclusions  - Left ventricle: The cavity size was normal. Wall thickness was normal. The estimated ejection fraction was in the range of 10% to 15%. Diffuse hypokinesis. Doppler parameters are consistent with restrictive physiology, indicative of decreased left ventricular diastolic compliance and/or increased left atrial pressure. - Aortic valve: Mildly calcified annulus. Trileaflet; mildly thickened leaflets. There was mild regurgitation. Valve area (  VTI): 1.6 cm^2. Valve area (Vmax): 1.73 cm^2. - Mitral valve: Mildly calcified annulus. Mildly thickened leaflets . There was mild regurgitation. The MR vena contracta is 0.3 cm. - Left atrium: The atrium was severely dilated. - Pulmonary veins: There is systolic blunting of pulmonary vein flow consistent with elevated LA pressures. - Right ventricle: The ventricular septum is flattened in systole consistent with RV pressure overload. The cavity size was mildly dilated. - Right atrium: The atrium was mildly dilated. - Atrial septum: No defect or patent foramen ovale was identified. - Pulmonary arteries: Systolic pressure was moderately increased. PA peak pressure: 44 mm Hg (S). - Technically adequate study.     Radiology/Studies  No results found.  ASSESSMENT AND PLAN  1. Newly diagnosed LV dysfunction by PCP in 11/2014  - echo 11/2014 showed LVEF 10-15%, diffuse hypokinesis, restrictive diastolic fyunction  - cath 01/31/2015 nonobstructive CAD, NICM.    - continue coreg and HCTZ. Not on lasix. Appears to be euvolemic on exam, has split S2 on exam. BP unable to uptitrate coreg or add ACEI  - stable for discharge today, followup with Dr. Harl Bowie in 2-3 weeks  - likely related to h/o HTN +/- questionable remote history of ETOH  2. HTN: went several yrs without taking BP meds  3. HLD  Signed, Almyra Deforest PA-C Pager: [phone]

## 2015-02-01 NOTE — Discharge Instructions (Signed)
Angiogram, Care After °Refer to this sheet in the next few weeks. These instructions provide you with information on caring for yourself after your procedure. Your health care provider may also give you more specific instructions. Your treatment has been planned according to current medical practices, but problems sometimes occur. Call your health care provider if you have any problems or questions after your procedure.  °WHAT TO EXPECT AFTER THE PROCEDURE °After your procedure, it is typical to have the following sensations: °· Minor discomfort or tenderness and a small bump at the catheter insertion site. The bump should usually decrease in size and tenderness within 1 to 2 weeks. °· Any bruising will usually fade within 2 to 4 weeks. °HOME CARE INSTRUCTIONS  °· You may need to keep taking blood thinners if they were prescribed for you. Take medicines only as directed by your health care provider. °· Do not apply powder or lotion to the site. °· Do not take baths, swim, or use a hot tub until your health care provider approves. °· You may shower 24 hours after the procedure. Remove the bandage (dressing) and gently wash the site with plain soap and water. Gently pat the site dry. °· Inspect the site at least twice daily. °· Limit your activity for the first 48 hours. Do not bend, squat, or lift anything over 20 lb (9 kg) or as directed by your health care provider. °· Plan to have someone take you home after the procedure. Follow instructions about when you can drive or return to work. °SEEK MEDICAL CARE IF: °· You get light-headed when standing up. °· You have drainage (other than a small amount of blood on the dressing). °· You have chills. °· You have a fever. °· You have redness, warmth, swelling, or pain at the insertion site. °SEEK IMMEDIATE MEDICAL CARE IF:  °· You develop chest pain or shortness of breath, feel faint, or pass out. °· You have bleeding, swelling larger than a walnut, or drainage from the  catheter insertion site. °· You develop pain, discoloration, coldness, or severe bruising in the leg or arm that held the catheter. °· You develop bleeding from any other place, such as the bowels. You may see bright red blood in your urine or stools, or your stools may appear black and tarry. °· You have heavy bleeding from the site. If this happens, hold pressure on the site. °MAKE SURE YOU: °· Understand these instructions. °· Will watch your condition. °· Will get help right away if you are not doing well or get worse. °Document Released: 12/10/2004 Document Revised: 10/08/2013 Document Reviewed: 10/16/2012 °ExitCare® Patient Information ©2015 [employer]. This information is not intended to replace advice given to you by your health care provider. Make sure you discuss any questions you have with your health care provider. ° °Radial Site Care °Refer to this sheet in the next few weeks. These instructions provide you with information on caring for yourself after your procedure. Your caregiver may also give you more specific instructions. Your treatment has been planned according to current medical practices, but problems sometimes occur. Call your caregiver if you have any problems or questions after your procedure. °HOME CARE INSTRUCTIONS °· You may shower the day after the procedure. Remove the bandage (dressing) and gently wash the site with plain soap and water. Gently pat the site dry. °· Do not apply powder or lotion to the site. °· Do not submerge the affected site in water for 3 to 5 days. °·   Inspect the site at least twice daily. °· Do not flex or bend the affected arm for 24 hours. °· No lifting over 5 pounds (2.3 kg) for 5 days after your procedure. °· Do not drive home if you are discharged the same day of the procedure. Have someone else drive you. °· You may drive 24 hours after the procedure unless otherwise instructed by your caregiver. °· Do not operate machinery or power tools for 24  hours. °· A responsible adult should be with you for the first 24 hours after you arrive home. °What to expect: °· Any bruising will usually fade within 1 to 2 weeks. °· Blood that collects in the tissue (hematoma) may be painful to the touch. It should usually decrease in size and tenderness within 1 to 2 weeks. °SEEK IMMEDIATE MEDICAL CARE IF: °· You have unusual pain at the radial site. °· You have redness, warmth, swelling, or pain at the radial site. °· You have drainage (other than a small amount of blood on the dressing). °· You have chills. °· You have a fever or persistent symptoms for more than 72 hours. °· You have a fever and your symptoms suddenly get worse. °· Your arm becomes pale, cool, tingly, or numb. °· You have heavy bleeding from the site. Hold pressure on the site. °Document Released: 06/26/2010 Document Revised: 08/16/2011 Document Reviewed: 06/26/2010 °ExitCare® Patient Information ©2015 [employer]. This information is not intended to replace advice given to you by your health care provider. Make sure you discuss any questions you have with your health care provider. ° °

## 2015-02-03 ENCOUNTER — Encounter (HOSPITAL_COMMUNITY): Payer: Self-pay | Admitting: Cardiovascular Disease

## 2015-02-07 ENCOUNTER — Encounter: Payer: Self-pay | Admitting: Adult Health

## 2015-02-07 ENCOUNTER — Ambulatory Visit (INDEPENDENT_AMBULATORY_CARE_PROVIDER_SITE_OTHER): Payer: Medicaid Other | Admitting: Adult Health

## 2015-02-07 VITALS — BP 108/72 | HR 92

## 2015-02-07 DIAGNOSIS — F1010 Alcohol abuse, uncomplicated: Secondary | ICD-10-CM | POA: Diagnosis not present

## 2015-02-07 DIAGNOSIS — I426 Alcoholic cardiomyopathy: Secondary | ICD-10-CM

## 2015-02-07 MED ORDER — POTASSIUM CHLORIDE CRYS ER 20 MEQ PO TBCR: 20.0000 meq | EXTENDED_RELEASE_TABLET | Freq: Every day | ORAL | Status: DC

## 2015-02-07 MED ORDER — CARVEDILOL 6.25 MG PO TABS: 6.2500 mg | ORAL_TABLET | Freq: Two times a day (BID) | ORAL | Status: DC

## 2015-02-07 MED ORDER — HYDROCHLOROTHIAZIDE 12.5 MG PO TABS: 12.5000 mg | ORAL_TABLET | Freq: Every day | ORAL | Status: DC
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
## 2015-02-07 NOTE — Patient Instructions (Addendum)
   Increase Coreg to 6.25mg  twice a day  - may take 2 tabs of your 3.25mg  tabs twice a day till finish current supply.  New sent to [facility] today.   Please take your HCTZ DAILY, not as needed.   Begin Potassium 29meq daily - new sent to [facility] today.   Continue all other medications.    Low sodium diet - info sheet provided today. Follow up in  2 weeks.

## 2015-02-07 NOTE — Progress Notes (Signed)
Name: GEOVANNIE VILAR    DOB: [birth year]  Age: 64 y.o.  MR#: [id number]       PCP:  [facility]: Payor: MEDICAID [place] / Plan: MEDICAID [place] ACCESS / Product Type: *No Product type* /   CC:   No chief complaint on file.   VS Filed Vitals:   02/07/15 1454  BP: 108/72  Pulse: 92  SpO2: 98%    Weights Current Weight  02/01/15 143 lb 11.2 oz (65.182 kg)  01/27/15 144 lb 12.8 oz (65.681 kg)  04/09/14 159 lb (72.122 kg)    Blood Pressure  BP Readings from Last 3 Encounters:  02/07/15 108/72  02/01/15 119/78  01/27/15 110/73     Admit date:  (Not on file) Last encounter with RMR:  Visit date not found   Allergy Review of patient's allergies indicates no known allergies.  Current Outpatient Prescriptions  Medication Sig Dispense Refill  . acetaminophen (TYLENOL) 500 MG tablet Take 500 mg by mouth every 8 (eight) hours as needed for mild pain or moderate pain.    Marland Kitchen atorvastatin (LIPITOR) 20 MG tablet Take 1 tablet (20 mg total) by mouth daily. 30 tablet 5  . carvedilol (COREG) 3.125 MG tablet Take 1 tablet (3.125 mg total) by mouth 2 (two) times daily. 180 tablet 3  . diclofenac sodium (VOLTAREN) 1 % GEL Apply 4 g topically 4 (four) times daily.    . hydrochlorothiazide (HYDRODIURIL) 12.5 MG tablet Take 1 tab daily as needed for swelling (Patient taking differently: Take 12.5 mg by mouth daily. Take 1 tab daily as needed for swelling) 30 tablet 3  . traMADol-acetaminophen (ULTRACET) 37.5-325 MG per tablet Take 1 tablet by mouth every 4 (four) hours as needed. 90 tablet 2  . traZODone (DESYREL) 50 MG tablet Take 100 mg by mouth at bedtime and may repeat dose one time if needed. For sleep    . Vitamin D, Ergocalciferol, (DRISDOL) 50000 UNITS CAPS capsule Take 50,000 Units by mouth every 7 (seven) days. Tuesday    . aspirin EC 81 MG tablet Take 1 tablet (81 mg total) by mouth daily.     No current facility-administered medications for this  visit.    Discontinued Meds:   There are no discontinued medications.  Patient Active Problem List   Diagnosis Date Noted  . Chronic systolic heart failure   . Chest pain 01/31/2015  . Non-ischemic cardiomyopathy   . Loss of weight 06/13/2013  . Fatty liver 04/09/2013  . Alcoholism 03/07/2013  . Alcohol dependence 01/25/2013  . Elevated ferritin 11/16/2012  . Anemia 07/17/2012  . Encounter for screening colonoscopy 07/17/2012    LABS    Component Value Date/Time   NA 138 02/01/2015 0436   NA 138 01/31/2015 1023   NA 143 01/24/2013 1704   K 4.1 02/01/2015 0436   K 3.9 01/31/2015 1023   K 3.7 01/24/2013 1704   CL 107 02/01/2015 0436   CL 104 01/31/2015 1023   CL 105 01/24/2013 1704   CO2 25 02/01/2015 0436   CO2 25 01/31/2015 1023   CO2 25 01/24/2013 1704   GLUCOSE 117* 02/01/2015 0436   GLUCOSE 121* 01/31/2015 1023   GLUCOSE 79 01/24/2013 1704   BUN 10 02/01/2015 0436   BUN 10 01/31/2015 1023   BUN 13 01/24/2013 1704   CREATININE 0.76 02/01/2015 0436   CREATININE 0.71 01/31/2015 1023   CREATININE 0.74 01/24/2013 1704   CALCIUM 9.2  02/01/2015 0436   CALCIUM 9.4 01/31/2015 1023   CALCIUM 9.7 01/24/2013 1704   GFRNONAA >60 02/01/2015 0436   GFRNONAA >60 01/31/2015 1023   GFRNONAA >90 01/24/2013 1704   GFRAA >60 02/01/2015 0436   GFRAA >60 01/31/2015 1023   GFRAA >90 01/24/2013 1704   CMP     Component Value Date/Time   NA 138 02/01/2015 0436   K 4.1 02/01/2015 0436   CL 107 02/01/2015 0436   CO2 25 02/01/2015 0436   GLUCOSE 117* 02/01/2015 0436   BUN 10 02/01/2015 0436   CREATININE 0.76 02/01/2015 0436   CALCIUM 9.2 02/01/2015 0436   PROT 6.1* 02/01/2015 0436   ALBUMIN 3.3* 02/01/2015 0436   AST 19 02/01/2015 0436   ALT 31 02/01/2015 0436   ALKPHOS 52 02/01/2015 0436   BILITOT 0.5 02/01/2015 0436   GFRNONAA >60 02/01/2015 0436   GFRAA >60 02/01/2015 0436       Component Value Date/Time   WBC 2.9* 02/01/2015 0436   WBC 3.1* 01/31/2015 1023    WBC 3.8* 05/07/2013 0922   HGB 11.4* 02/01/2015 0436   HGB 12.7* 01/31/2015 1023   HGB 12.9* 05/07/2013 0922   HCT 35.9* 02/01/2015 0436   HCT 39.4 01/31/2015 1023   HCT 38.0* 05/07/2013 0922   HCT 37 03/14/2012   MCV 90.4 02/01/2015 0436   MCV 89.3 01/31/2015 1023   MCV 84.3 05/07/2013 0922   MCV 86.2 03/14/2012    Lipid Panel     Component Value Date/Time   CHOL 165 02/01/2015 0436   TRIG 188* 02/01/2015 0436   HDL 49 02/01/2015 0436   CHOLHDL 3.4 02/01/2015 0436   VLDL 38 02/01/2015 0436   LDLCALC 78 02/01/2015 0436    ABG    Component Value Date/Time   PHART 7.384 01/31/2015 1256   PCO2ART 40.2 01/31/2015 1256   PO2ART 75.0* 01/31/2015 1256   HCO3 24.0 01/31/2015 1256   TCO2 25 01/31/2015 1256   ACIDBASEDEF 1.0 01/31/2015 1256   O2SAT 95.0 01/31/2015 1256     No results found for: TSH BNP (last 3 results) No results for input(s): BNP in the last 8760 hours.  ProBNP (last 3 results) No results for input(s): PROBNP in the last 8760 hours.  Cardiac Panel (last 3 results) No results for input(s): CKTOTAL, CKMB, TROPONINI, RELINDX in the last 72 hours.  Iron/TIBC/Ferritin/ %Sat    Component Value Date/Time   IRON 117 05/07/2013 0922   FERRITIN 883* 03/01/2013 1020     EKG Orders placed or performed in visit on 01/27/15  . EKG 12-Lead     Prior Assessment and Plan Problem List as of 02/07/2015      Cardiovascular and Mediastinum   Non-ischemic cardiomyopathy   Chronic systolic heart failure     Digestive   Fatty liver   Last Assessment & Plan 04/09/2014 Office Visit Written 04/09/2014  2:06 PM by Orvil Feil, NP    Continue to recommend ETOH abstinence. Recheck in April 2016.         Other   Anemia   Last Assessment & Plan 04/09/2013 Office Visit Written 04/09/2013 11:31 AM by Orvil Feil, NP    Normocytic. No signs of GI etiology currently. Colonoscopy up-to-date with need for surveillance in March 2019. Iron normal with elevated ferritin, which is  in the setting of ETOH abuse. No upper GI symptoms such as dysphagia, abdominal pain, reflux. He does note occasional early satiety, but this is quite vague in report.  Weight has fluctuated from 148 to high of 153 since Feb 2014. I discussed the possibility of proceeding with an EGD in the future if he notes any significant change in appetite, further weight loss, or evidence of early IDA. In the setting of ETOH abuse, I question vague early satiety related to this. However, unable to exclude occult malignancy but less likely.   Will redraw CBC and iron in mid December.  Return in Jan 2015.  Low-threshold for EGD.       Encounter for screening colonoscopy   Last Assessment & Plan 07/17/2012 Office Visit Written 07/17/2012  3:34 PM by Orvil Feil, NP    64 year old male presenting for initial screening colonoscopy, overdue at this time. No lower GI symptoms. He thinks his father may have had colon cancer in his 57s or 36s, but Mr. Irving is unsure. It is somewhat difficult to obtain an accurate family history from him. Upon review of labs, Hgb mild normocytic anemia noted with Hgb 12. Pt has been without a primary care physician for 30+ years.   I would like to obtain an updated CBC and establish a baseline iron and ferritin. If evidence of IDA, needs EGD at time of TCS. Heme status unknown.   Proceed with TCS+/- EGD with Dr. Gala Romney in near future: the risks, benefits, and alternatives have been discussed with the patient in detail. The patient states understanding and desires to proceed.       Elevated ferritin   Last Assessment & Plan 04/09/2013 Office Visit Written 04/09/2013 11:31 AM by Orvil Feil, NP    In setting of ETOH.       Alcohol dependence   Alcoholism   Loss of weight   Last Assessment & Plan 04/09/2014 Office Visit Written 04/09/2014  2:06 PM by Orvil Feil, NP    Improved/Resolved. Weight up several pounds from last visit. Likely multifactorial. Return in 6 months.       Chest  pain       Imaging: No results found.

## 2015-02-20 ENCOUNTER — Ambulatory Visit (INDEPENDENT_AMBULATORY_CARE_PROVIDER_SITE_OTHER): Payer: Medicaid Other | Admitting: Adult Health

## 2015-02-20 ENCOUNTER — Encounter: Payer: Self-pay | Admitting: Adult Health

## 2015-02-20 VITALS — BP 116/80 | HR 92 | Ht 68.0 in | Wt 147.1 lb

## 2015-02-20 DIAGNOSIS — F1010 Alcohol abuse, uncomplicated: Secondary | ICD-10-CM

## 2015-02-20 DIAGNOSIS — I426 Alcoholic cardiomyopathy: Secondary | ICD-10-CM

## 2015-02-20 MED ORDER — ASPIRIN EC 81 MG PO TBEC: 81.0000 mg | DELAYED_RELEASE_TABLET | Freq: Every day | ORAL | Status: DC

## 2015-02-20 MED ORDER — CARVEDILOL 12.5 MG PO TABS: 12.5000 mg | ORAL_TABLET | Freq: Two times a day (BID) | ORAL | Status: DC

## 2015-02-20 NOTE — Patient Instructions (Signed)
Your physician recommends that you schedule a follow-up appointment in: 1 month with Dr. Harl Bowie in [place] has recommended you make the following change in your medication:   Increase Coreg to 12.5 mg Two times Daily  Thank you for choosing [facility]!
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
## 2015-02-20 NOTE — Progress Notes (Deleted)
Name: Gabriel Hammond    DOB: 01-28-1951  Age: 64 y.o.  MR#: [id number]       PCP:  [facility]: Payor: MEDICAID [place] / Plan: MEDICAID [place] ACCESS / Product Type: *No Product type* /   CC:   No chief complaint on file.   VS Filed Vitals:   02/20/15 1510  BP: 116/80  Pulse: 92  Height: 5\' 8"  (1.727 m)  Weight: 147 lb 1.6 oz (66.724 kg)  SpO2: 99%    Weights Current Weight  02/20/15 147 lb 1.6 oz (66.724 kg)  02/01/15 143 lb 11.2 oz (65.182 kg)  01/27/15 144 lb 12.8 oz (65.681 kg)    Blood Pressure  BP Readings from Last 3 Encounters:  02/20/15 116/80  02/07/15 108/72  02/01/15 119/78     Admit date:  (Not on file) Last encounter with RMR:  02/07/2015   Allergy Review of patient's allergies indicates no known allergies.  Current Outpatient Prescriptions  Medication Sig Dispense Refill  . acetaminophen (TYLENOL) 500 MG tablet Take 500 mg by mouth every 8 (eight) hours as needed for mild pain or moderate pain.    Marland Kitchen aspirin EC 81 MG tablet Take 1 tablet (81 mg total) by mouth daily.    Marland Kitchen atorvastatin (LIPITOR) 20 MG tablet Take 1 tablet (20 mg total) by mouth daily. 30 tablet 5  . carvedilol (COREG) 6.25 MG tablet Take 1 tablet (6.25 mg total) by mouth 2 (two) times daily. 60 tablet 6  . diclofenac sodium (VOLTAREN) 1 % GEL Apply 4 g topically 4 (four) times daily.    . hydrochlorothiazide (HYDRODIURIL) 12.5 MG tablet Take 1 tablet (12.5 mg total) by mouth daily.    . potassium chloride SA (K-DUR,KLOR-CON) 20 MEQ tablet Take 1 tablet (20 mEq total) by mouth daily. 30 tablet 6  . traMADol-acetaminophen (ULTRACET) 37.5-325 MG per tablet Take 1 tablet by mouth every 4 (four) hours as needed. 90 tablet 2  . traZODone (DESYREL) 50 MG tablet Take 100 mg by mouth at bedtime and may repeat dose one time if needed. For sleep    . Vitamin D, Ergocalciferol, (DRISDOL) 50000 UNITS CAPS capsule Take 50,000 Units by mouth every 7 (seven) days.  Tuesday     No current facility-administered medications for this visit.    Discontinued Meds:   There are no discontinued medications.  Patient Active Problem List   Diagnosis Date Noted  . Chronic systolic heart failure   . Chest pain 01/31/2015  . Non-ischemic cardiomyopathy   . Loss of weight 06/13/2013  . Fatty liver 04/09/2013  . Alcoholism 03/07/2013  . Alcohol dependence 01/25/2013  . Elevated ferritin 11/16/2012  . Anemia 07/17/2012  . Encounter for screening colonoscopy 07/17/2012    LABS    Component Value Date/Time   NA 138 02/01/2015 0436   NA 138 01/31/2015 1023   NA 143 01/24/2013 1704   K 4.1 02/01/2015 0436   K 3.9 01/31/2015 1023   K 3.7 01/24/2013 1704   CL 107 02/01/2015 0436   CL 104 01/31/2015 1023   CL 105 01/24/2013 1704   CO2 25 02/01/2015 0436   CO2 25 01/31/2015 1023   CO2 25 01/24/2013 1704   GLUCOSE 117* 02/01/2015 0436   GLUCOSE 121* 01/31/2015 1023   GLUCOSE 79 01/24/2013 1704   BUN 10 02/01/2015 0436   BUN 10 01/31/2015 1023   BUN 13 01/24/2013 1704   CREATININE 0.76 02/01/2015  0436   CREATININE 0.71 01/31/2015 1023   CREATININE 0.74 01/24/2013 1704   CALCIUM 9.2 02/01/2015 0436   CALCIUM 9.4 01/31/2015 1023   CALCIUM 9.7 01/24/2013 1704   GFRNONAA >60 02/01/2015 0436   GFRNONAA >60 01/31/2015 1023   GFRNONAA >90 01/24/2013 1704   GFRAA >60 02/01/2015 0436   GFRAA >60 01/31/2015 1023   GFRAA >90 01/24/2013 1704   CMP     Component Value Date/Time   NA 138 02/01/2015 0436   K 4.1 02/01/2015 0436   CL 107 02/01/2015 0436   CO2 25 02/01/2015 0436   GLUCOSE 117* 02/01/2015 0436   BUN 10 02/01/2015 0436   CREATININE 0.76 02/01/2015 0436   CALCIUM 9.2 02/01/2015 0436   PROT 6.1* 02/01/2015 0436   ALBUMIN 3.3* 02/01/2015 0436   AST 19 02/01/2015 0436   ALT 31 02/01/2015 0436   ALKPHOS 52 02/01/2015 0436   BILITOT 0.5 02/01/2015 0436   GFRNONAA >60 02/01/2015 0436   GFRAA >60 02/01/2015 0436       Component Value  Date/Time   WBC 2.9* 02/01/2015 0436   WBC 3.1* 01/31/2015 1023   WBC 3.8* 05/07/2013 0922   HGB 11.4* 02/01/2015 0436   HGB 12.7* 01/31/2015 1023   HGB 12.9* 05/07/2013 0922   HCT 35.9* 02/01/2015 0436   HCT 39.4 01/31/2015 1023   HCT 38.0* 05/07/2013 0922   HCT 37 03/14/2012   MCV 90.4 02/01/2015 0436   MCV 89.3 01/31/2015 1023   MCV 84.3 05/07/2013 0922   MCV 86.2 03/14/2012    Lipid Panel     Component Value Date/Time   CHOL 165 02/01/2015 0436   TRIG 188* 02/01/2015 0436   HDL 49 02/01/2015 0436   CHOLHDL 3.4 02/01/2015 0436   VLDL 38 02/01/2015 0436   LDLCALC 78 02/01/2015 0436    ABG    Component Value Date/Time   PHART 7.384 01/31/2015 1256   PCO2ART 40.2 01/31/2015 1256   PO2ART 75.0* 01/31/2015 1256   HCO3 24.0 01/31/2015 1256   TCO2 25 01/31/2015 1256   ACIDBASEDEF 1.0 01/31/2015 1256   O2SAT 95.0 01/31/2015 1256     No results found for: TSH BNP (last 3 results) No results for input(s): BNP in the last 8760 hours.  ProBNP (last 3 results) No results for input(s): PROBNP in the last 8760 hours.  Cardiac Panel (last 3 results) No results for input(s): CKTOTAL, CKMB, TROPONINI, RELINDX in the last 72 hours.  Iron/TIBC/Ferritin/ %Sat    Component Value Date/Time   IRON 117 05/07/2013 0922   FERRITIN 883* 03/01/2013 1020     EKG Orders placed or performed in visit on 01/27/15  . EKG 12-Lead     Prior Assessment and Plan Problem List as of 02/20/2015      Cardiovascular and Mediastinum   Non-ischemic cardiomyopathy   Chronic systolic heart failure     Digestive   Fatty liver   Last Assessment & Plan 04/09/2014 Office Visit Written 04/09/2014  2:06 PM by Orvil Feil, NP    Continue to recommend ETOH abstinence. Recheck in April 2016.         Other   Anemia   Last Assessment & Plan 04/09/2013 Office Visit Written 04/09/2013 11:31 AM by Orvil Feil, NP    Normocytic. No signs of GI etiology currently. Colonoscopy up-to-date with need for  surveillance in March 2019. Iron normal with elevated ferritin, which is in the setting of ETOH abuse. No upper GI symptoms such as  dysphagia, abdominal pain, reflux. He does note occasional early satiety, but this is quite vague in report. Weight has fluctuated from 148 to high of 153 since Feb 2014. I discussed the possibility of proceeding with an EGD in the future if he notes any significant change in appetite, further weight loss, or evidence of early IDA. In the setting of ETOH abuse, I question vague early satiety related to this. However, unable to exclude occult malignancy but less likely.   Will redraw CBC and iron in mid December.  Return in Jan 2015.  Low-threshold for EGD.       Encounter for screening colonoscopy   Last Assessment & Plan 07/17/2012 Office Visit Written 07/17/2012  3:34 PM by Orvil Feil, NP    64 year old male presenting for initial screening colonoscopy, overdue at this time. No lower GI symptoms. He thinks his father may have had colon cancer in his 61s or 16s, but Mr. Deemer is unsure. It is somewhat difficult to obtain an accurate family history from him. Upon review of labs, Hgb mild normocytic anemia noted with Hgb 12. Pt has been without a primary care physician for 30+ years.   I would like to obtain an updated CBC and establish a baseline iron and ferritin. If evidence of IDA, needs EGD at time of TCS. Heme status unknown.   Proceed with TCS+/- EGD with Dr. Gala Romney in near future: the risks, benefits, and alternatives have been discussed with the patient in detail. The patient states understanding and desires to proceed.       Elevated ferritin   Last Assessment & Plan 04/09/2013 Office Visit Written 04/09/2013 11:31 AM by Orvil Feil, NP    In setting of ETOH.       Alcohol dependence   Alcoholism   Loss of weight   Last Assessment & Plan 04/09/2014 Office Visit Written 04/09/2014  2:06 PM by Orvil Feil, NP    Improved/Resolved. Weight up several pounds  from last visit. Likely multifactorial. Return in 6 months.       Chest pain       Imaging: No results found.

## 2015-04-08 ENCOUNTER — Ambulatory Visit: Payer: Medicaid Other | Admitting: Cardiology

## 2015-04-10 ENCOUNTER — Encounter: Payer: Self-pay | Admitting: Cardiology

## 2015-04-10 ENCOUNTER — Ambulatory Visit (INDEPENDENT_AMBULATORY_CARE_PROVIDER_SITE_OTHER): Payer: Medicaid Other | Admitting: Cardiology

## 2015-04-10 VITALS — BP 110/72 | HR 80 | Ht 68.0 in | Wt 147.0 lb

## 2015-04-10 DIAGNOSIS — I5022 Chronic systolic (congestive) heart failure: Secondary | ICD-10-CM

## 2015-04-10 DIAGNOSIS — Z79899 Other long term (current) drug therapy: Secondary | ICD-10-CM

## 2015-04-10 DIAGNOSIS — Z23 Encounter for immunization: Secondary | ICD-10-CM | POA: Diagnosis not present

## 2015-04-10 MED ORDER — SACUBITRIL-VALSARTAN 24-26 MG PO TABS: 1.0000 | ORAL_TABLET | Freq: Two times a day (BID) | ORAL | Status: DC
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
## 2015-04-10 NOTE — Patient Instructions (Addendum)
Medication Instructions:  Your physician has recommended you make the following change in your medication:  Start Entresto Two times Daily  Labwork: Your physician recommends that you return for lab work in: 2 weeks   Testing/Procedures: None  Follow-Up: Your physician recommends that you schedule a follow-up appointment in: 3 Weeks    Any Other Special Instructions Will Be Listed Below (If Applicable).     If you need a refill on your cardiac medications before your next appointment, please call your pharmacy.  Thank you for choosing [facility]!

## 2015-04-22 ENCOUNTER — Telehealth: Payer: Self-pay

## 2015-04-22 NOTE — Telephone Encounter (Signed)
Prior auth for [employer] 24-26 mg initiated through [employer]. Must go through pharmacy review. Confir # [id number]. Will have a decision in 24 hours.

## 2015-04-23 ENCOUNTER — Telehealth: Payer: Self-pay

## 2015-04-23 NOTE — Telephone Encounter (Signed)
Entresto 24-26mg  approved through [employer] for one year. Pharmacy notified.

## 2015-04-28 ENCOUNTER — Other Ambulatory Visit: Payer: Self-pay | Admitting: Adult Health

## 2015-05-08 ENCOUNTER — Ambulatory Visit: Payer: Medicaid Other | Admitting: Cardiology

## 2015-05-26 ENCOUNTER — Telehealth: Payer: Self-pay

## 2015-05-26 ENCOUNTER — Encounter: Payer: Self-pay | Admitting: Cardiology

## 2015-05-26 ENCOUNTER — Ambulatory Visit (INDEPENDENT_AMBULATORY_CARE_PROVIDER_SITE_OTHER): Payer: Medicaid Other | Admitting: Cardiology

## 2015-05-26 VITALS — BP 110/66 | HR 76 | Ht 68.0 in | Wt 164.6 lb

## 2015-05-26 DIAGNOSIS — Z79899 Other long term (current) drug therapy: Secondary | ICD-10-CM

## 2015-05-26 DIAGNOSIS — I5022 Chronic systolic (congestive) heart failure: Secondary | ICD-10-CM

## 2015-05-26 DIAGNOSIS — R000 Tachycardia, unspecified: Secondary | ICD-10-CM | POA: Diagnosis not present

## 2015-05-26 LAB — BASIC METABOLIC PANEL
BUN: 16 mg/dL (ref 7–25)
CO2: 26 mmol/L (ref 20–31)
Calcium: 10 mg/dL (ref 8.6–10.3)
Chloride: 99 mmol/L (ref 98–110)
Creat: 0.76 mg/dL (ref 0.70–1.25)
Glucose, Bld: 103 mg/dL — ABNORMAL HIGH (ref 65–99)
Potassium: 4.3 mmol/L (ref 3.5–5.3)
Sodium: 136 mmol/L (ref 135–146)

## 2015-05-26 LAB — TSH: TSH: 1.324 u[IU]/mL (ref 0.350–4.500)

## 2015-05-26 LAB — MAGNESIUM: Magnesium: 1.6 mg/dL (ref 1.5–2.5)

## 2015-05-26 MED ORDER — CARVEDILOL 25 MG PO TABS: 25.0000 mg | ORAL_TABLET | Freq: Two times a day (BID) | ORAL | Status: DC

## 2015-05-26 NOTE — Telephone Encounter (Signed)
Invalid phone numbers for pt.
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
## 2015-05-26 NOTE — Telephone Encounter (Signed)
-----   Message from Arnoldo Lenis, MD sent at 05/26/2015  3:58 PM EST ----- Labs look good  Zandra Abts MD

## 2015-05-26 NOTE — Patient Instructions (Signed)
Medication Instructions:  INCREASE COREG TO 25 MG TWO TIMES DAILY  Labwork: Your physician recommends that you return for lab work in: TODAY TSH MAGNESIUM BMET   Testing/Procedures: NONE  Follow-Up: Your physician recommends that you schedule a follow-up appointment in: [place] DR. BRANCH   Any Other Special Instructions Will Be Listed Below (If Applicable).     If you need a refill on your cardiac medications before your next appointment, please call your pharmacy Thanks for choosing [facility]!!!

## 2015-05-28 ENCOUNTER — Other Ambulatory Visit: Payer: Self-pay | Admitting: Cardiology

## 2015-06-25 ENCOUNTER — Other Ambulatory Visit: Payer: Self-pay | Admitting: Physician Assistant

## 2015-06-25 NOTE — Telephone Encounter (Signed)
Please review for refill, Thank you. 

## 2015-07-17 ENCOUNTER — Ambulatory Visit: Payer: Self-pay | Admitting: Cardiology

## 2015-07-18 ENCOUNTER — Ambulatory Visit (INDEPENDENT_AMBULATORY_CARE_PROVIDER_SITE_OTHER): Payer: Medicaid Other | Admitting: Cardiology

## 2015-07-18 ENCOUNTER — Encounter: Payer: Self-pay | Admitting: Cardiology

## 2015-07-18 VITALS — BP 122/72 | HR 73 | Ht 68.0 in | Wt 154.0 lb

## 2015-07-18 DIAGNOSIS — E785 Hyperlipidemia, unspecified: Secondary | ICD-10-CM | POA: Diagnosis not present

## 2015-07-18 DIAGNOSIS — I10 Essential (primary) hypertension: Secondary | ICD-10-CM | POA: Diagnosis not present

## 2015-07-18 DIAGNOSIS — I5022 Chronic systolic (congestive) heart failure: Secondary | ICD-10-CM | POA: Diagnosis not present

## 2015-07-18 MED ORDER — SACUBITRIL-VALSARTAN 49-51 MG PO TABS: 1.0000 | ORAL_TABLET | Freq: Two times a day (BID) | ORAL | Status: DC

## 2015-07-18 NOTE — Patient Instructions (Signed)
Your physician recommends that you schedule a follow-up appointment in: 1 month with Dr Harl Bowie     INCREASE Delene Loll to 49/51 mg twice a day     If you need a refill on your cardiac medications before your next appointment, please call your pharmacy.      Thank you for choosing [facility] !
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
## 2015-07-24 ENCOUNTER — Telehealth: Payer: Self-pay | Admitting: Cardiology

## 2015-07-24 NOTE — Telephone Encounter (Signed)
Please call patient's social worker in regards to new dosage on medication. / tg

## 2015-07-25 NOTE — Telephone Encounter (Signed)
Have called the social worker and left three voicemails for her to call me concerning pt medication dosage.

## 2015-07-25 NOTE — Telephone Encounter (Signed)
Collin from [facility] called to report that he has requested a PA 3x and they cannot get this new dose approved. Please call with any advice or changes that they need to make.   Pharmacy expressed concern that the patient will run out of Rx if we don't get this filled soon.

## 2015-07-28 NOTE — Telephone Encounter (Signed)
Drug Entresto 49/51 approved per [facility] and they will deliver to patient

## 2015-07-31 ENCOUNTER — Other Ambulatory Visit: Payer: Self-pay | Admitting: Cardiology

## 2015-07-31 ENCOUNTER — Other Ambulatory Visit: Payer: Self-pay | Admitting: Adult Health

## 2015-08-25 ENCOUNTER — Other Ambulatory Visit: Payer: Self-pay | Admitting: Cardiology

## 2015-09-02 ENCOUNTER — Encounter: Payer: Self-pay | Admitting: Cardiology

## 2015-09-02 ENCOUNTER — Ambulatory Visit (INDEPENDENT_AMBULATORY_CARE_PROVIDER_SITE_OTHER): Payer: Medicaid Other | Admitting: Cardiology

## 2015-09-02 VITALS — BP 110/60 | HR 66 | Ht 69.0 in | Wt 155.0 lb

## 2015-09-02 DIAGNOSIS — I10 Essential (primary) hypertension: Secondary | ICD-10-CM | POA: Diagnosis not present

## 2015-09-02 DIAGNOSIS — E785 Hyperlipidemia, unspecified: Secondary | ICD-10-CM

## 2015-09-02 DIAGNOSIS — I5022 Chronic systolic (congestive) heart failure: Secondary | ICD-10-CM

## 2015-09-02 MED ORDER — SACUBITRIL-VALSARTAN 97-103 MG PO TABS: 1.0000 | ORAL_TABLET | Freq: Two times a day (BID) | ORAL | Status: DC

## 2015-09-02 NOTE — Patient Instructions (Signed)
Medication Instructions:  Increase Entresto to 97/103 mg teo times daily   Labwork: I WILL REQUEST LABS FROM PCP  Testing/Procedures: Your physician has requested that you have an echocardiogram. Echocardiography is a painless test that uses sound waves to create images of your heart. It provides your doctor with information about the size and shape of your heart and how well your heart's chambers and valves are working. This procedure takes approximately one hour. There are no restrictions for this procedure.    Follow-Up: Your physician recommends that you schedule a follow-up appointment in: 6 WEEKS    Any Other Special Instructions Will Be Listed Below (If Applicable).     If you need a refill on your cardiac medications before your next appointment, please call your pharmacy.
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
## 2015-09-04 ENCOUNTER — Telehealth: Payer: Self-pay | Admitting: *Deleted

## 2015-09-04 NOTE — Telephone Encounter (Signed)
PA initiated for Entresto 97-103 mg PA # [id number] [place] [employer] at [phone]

## 2015-09-12 ENCOUNTER — Ambulatory Visit (HOSPITAL_COMMUNITY)
Admission: RE | Admit: 2015-09-12 | Discharge: 2015-09-12 | Disposition: A | Discharge status: Home / Self Care | Payer: Medicaid Other | Source: Ambulatory Visit | Attending: Cardiology | Admitting: Cardiology

## 2015-09-12 DIAGNOSIS — I071 Rheumatic tricuspid insufficiency: Secondary | ICD-10-CM | POA: Diagnosis not present

## 2015-09-12 DIAGNOSIS — I341 Nonrheumatic mitral (valve) prolapse: Secondary | ICD-10-CM | POA: Insufficient documentation

## 2015-09-12 DIAGNOSIS — I351 Nonrheumatic aortic (valve) insufficiency: Secondary | ICD-10-CM | POA: Diagnosis not present

## 2015-09-12 DIAGNOSIS — I5022 Chronic systolic (congestive) heart failure: Secondary | ICD-10-CM | POA: Insufficient documentation

## 2015-09-12 DIAGNOSIS — I340 Nonrheumatic mitral (valve) insufficiency: Secondary | ICD-10-CM | POA: Insufficient documentation

## 2015-09-12 DIAGNOSIS — I5021 Acute systolic (congestive) heart failure: Secondary | ICD-10-CM | POA: Diagnosis present

## 2015-09-12 DIAGNOSIS — I5189 Other ill-defined heart diseases: Secondary | ICD-10-CM | POA: Diagnosis not present

## 2015-09-23 ENCOUNTER — Other Ambulatory Visit: Payer: Self-pay | Admitting: Cardiology

## 2015-10-21 ENCOUNTER — Ambulatory Visit (INDEPENDENT_AMBULATORY_CARE_PROVIDER_SITE_OTHER): Payer: Medicaid Other | Admitting: Cardiology

## 2015-10-21 ENCOUNTER — Encounter: Payer: Self-pay | Admitting: Cardiology

## 2015-10-21 VITALS — BP 90/60 | HR 73 | Ht 68.0 in | Wt 144.0 lb

## 2015-10-21 DIAGNOSIS — I5022 Chronic systolic (congestive) heart failure: Secondary | ICD-10-CM | POA: Diagnosis not present
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
## 2015-10-21 NOTE — Patient Instructions (Signed)

## 2015-11-06 ENCOUNTER — Other Ambulatory Visit: Payer: Self-pay | Admitting: Cardiology

## 2015-12-08 ENCOUNTER — Other Ambulatory Visit: Payer: Self-pay | Admitting: Cardiology

## 2016-01-06 ENCOUNTER — Other Ambulatory Visit: Payer: Self-pay | Admitting: Adult Health

## 2016-02-06 ENCOUNTER — Other Ambulatory Visit: Payer: Self-pay | Admitting: Cardiology

## 2016-03-08 ENCOUNTER — Other Ambulatory Visit: Payer: Self-pay | Admitting: Cardiology

## 2016-04-07 ENCOUNTER — Other Ambulatory Visit: Payer: Self-pay | Admitting: Cardiology

## 2016-05-07 ENCOUNTER — Other Ambulatory Visit: Payer: Self-pay | Admitting: Cardiology

## 2016-05-07 NOTE — Telephone Encounter (Signed)
Asa refilled

## 2016-05-24 DIAGNOSIS — F320 Major depressive disorder, single episode, mild: Secondary | ICD-10-CM | POA: Diagnosis not present

## 2016-06-04 ENCOUNTER — Telehealth: Payer: Self-pay | Admitting: Adult Health

## 2016-06-04 NOTE — Telephone Encounter (Signed)
Left msg with Vaughan Basta at [phone] that pt was approved on 05/28/16 in Cover my Meds

## 2016-06-04 NOTE — Telephone Encounter (Signed)
Calling in regards to Authorization for Entresto./ tg

## 2016-07-19 DIAGNOSIS — F320 Major depressive disorder, single episode, mild: Secondary | ICD-10-CM | POA: Diagnosis not present

## 2016-08-19 DIAGNOSIS — F320 Major depressive disorder, single episode, mild: Secondary | ICD-10-CM | POA: Diagnosis not present

## 2016-08-31 ENCOUNTER — Other Ambulatory Visit: Payer: Self-pay | Admitting: Cardiology

## 2016-09-27 ENCOUNTER — Other Ambulatory Visit: Payer: Self-pay | Admitting: Cardiology

## 2016-10-11 DIAGNOSIS — F329 Major depressive disorder, single episode, unspecified: Secondary | ICD-10-CM | POA: Diagnosis not present

## 2016-10-25 ENCOUNTER — Other Ambulatory Visit: Payer: Self-pay | Admitting: Cardiology

## 2016-11-08 DIAGNOSIS — F320 Major depressive disorder, single episode, mild: Secondary | ICD-10-CM | POA: Diagnosis not present

## 2016-12-30 ENCOUNTER — Other Ambulatory Visit: Payer: Self-pay | Admitting: Cardiology

## 2017-01-26 ENCOUNTER — Other Ambulatory Visit: Payer: Self-pay | Admitting: Cardiology

## 2017-02-21 ENCOUNTER — Encounter: Payer: Self-pay | Admitting: Cardiology

## 2017-02-21 ENCOUNTER — Ambulatory Visit (INDEPENDENT_AMBULATORY_CARE_PROVIDER_SITE_OTHER): Payer: Medicare Other | Admitting: Cardiology

## 2017-02-21 VITALS — BP 106/66 | HR 67 | Ht 70.0 in | Wt 162.0 lb

## 2017-02-21 DIAGNOSIS — R079 Chest pain, unspecified: Secondary | ICD-10-CM | POA: Diagnosis not present
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
## 2017-02-21 NOTE — Patient Instructions (Signed)

## 2017-03-01 ENCOUNTER — Other Ambulatory Visit: Payer: Self-pay | Admitting: Cardiology

## 2017-03-31 ENCOUNTER — Other Ambulatory Visit: Payer: Self-pay | Admitting: Cardiology

## 2017-05-20 ENCOUNTER — Other Ambulatory Visit: Payer: Self-pay | Admitting: Adult Health

## 2017-06-01 ENCOUNTER — Other Ambulatory Visit: Payer: Self-pay | Admitting: Cardiology

## 2017-07-05 ENCOUNTER — Encounter: Payer: Self-pay | Admitting: Internal Medicine

## 2017-07-29 ENCOUNTER — Other Ambulatory Visit: Payer: Self-pay | Admitting: Cardiology

## 2017-08-09 ENCOUNTER — Ambulatory Visit (INDEPENDENT_AMBULATORY_CARE_PROVIDER_SITE_OTHER): Payer: Medicare Other

## 2017-08-09 DIAGNOSIS — Z86010 Personal history of colonic polyps: Secondary | ICD-10-CM

## 2017-08-09 MED ORDER — PEG 3350-KCL-NA BICARB-NACL 420 G PO SOLR: 4000.0000 mL | ORAL | 0 refills | Status: DC

## 2017-08-09 NOTE — Patient Instructions (Signed)
Gabriel Hammond   27-Apr-1951 MRN: [id number]    Procedure Date: 09/27/17 Time to register: 9:30 Place to register: [place] Stay Procedure Time: 10:30 Scheduled provider: R. Garfield Cornea, MD  PREPARATION FOR COLONOSCOPY WITH TRI-LYTE SPLIT PREP  Please notify us immediately if you are diabetic, take iron supplements, or if you are on Coumadin or any other blood thinners.     You will need to purchase 1 fleet enema and 1 box of Bisacodyl '5mg'$  tablets.   2 DAYS BEFORE PROCEDURE:  DATE: 09/25/17   DAY: Sunday Begin clear liquid diet AFTER your lunch meal. NO SOLID FOODS after this point.  1 DAY BEFORE PROCEDURE:  DATE: 09/27/17   DAY: Monday Continue clear liquids the entire day - NO SOLID FOOD.    At 2:00 pm:  Take 2 Bisacodyl tablets.   At 4:00pm:  Start drinking your solution. Make sure you mix well per instructions on the bottle. Try to drink 1 (one) 8 ounce glass every 10-15 minutes until you have consumed HALF the jug. You should complete by 6:00pm.You must keep the left over solution refrigerated until completed next day.  Continue clear liquids. You must drink plenty of clear liquids to prevent dehyration and kidney failure. Nothing to eat or drink after midnight.  EXCEPTION: If you take medications for your heart, blood pressure or breathing, you may take these medications with a small amount of clear liquid.    DAY OF PROCEDURE:   DATE: 09/27/17   DAY: Tuesday    Five hours before your procedure time @ 5:30am:  Finish remaining amout of bowel prep, drinking 1 (one) 8 ounce glass every 10-15 minutes until complete. You have two hours to consume remaining prep.   Three hours before your procedure time '@7'$ :30am:  Nothing by mouth.   At least one hour before going to the hospital:  Give yourself one Fleet enema. You may take your morning medications with sip of water unless we have instructed otherwise.      Please see below for Dietary Information.  CLEAR  LIQUIDS INCLUDE:  Water Jello (NOT red in color)   Ice Popsicles (NOT red in color)   Tea (sugar ok, no milk/cream) Powdered fruit flavored drinks  Coffee (sugar ok, no milk/cream) Gatorade/ Lemonade/ Kool-Aid  (NOT red in color)   Juice: apple, white grape, white cranberry Soft drinks  Clear bullion, consomme, broth (fat free beef/chicken/vegetable)  Carbonated beverages (any kind)  Strained chicken noodle soup Hard Candy   Remember: Clear liquids are liquids that will allow you to see your fingers on the other side of a clear glass. Be sure liquids are NOT red in color, and not cloudy, but CLEAR.  DO NOT EAT OR DRINK ANY OF THE FOLLOWING:  Dairy products of any kind   Cranberry juice Tomato juice / V8 juice   Grapefruit juice Orange juice     Red grape juice  Do not eat any solid foods, including such foods as: cereal, oatmeal, yogurt, fruits, vegetables, creamed soups, eggs, bread, crackers, pureed foods in a blender, etc.   HELPFUL HINTS FOR DRINKING PREP SOLUTION:   Make sure prep is extremely cold. Mix and refrigerate the the morning of the prep. You may also put in the freezer.   You may try mixing some Crystal Light or Country Time Lemonade if you prefer. Mix in small amounts; add more if necessary.  Try drinking through a straw  Rinse mouth with water or a mouthwash between  glasses, to remove after-taste.  Try sipping on a cold beverage /ice/ popsicles between glasses of prep.  Place a piece of sugar-free hard candy in mouth between glasses.  If you become nauseated, try consuming smaller amounts, or stretch out the time between glasses. Stop for 30-60 minutes, then slowly start back drinking.        OTHER INSTRUCTIONS  You will need a responsible adult at least 67 years of age to accompany you and drive you home. This person must remain in the waiting room during your procedure. The hospital will cancel your procedure if you do not have a responsible adult with  you.   1. Wear loose fitting clothing that is easily removed. 2. Leave jewelry and other valuables at home.  3. Remove all body piercing jewelry and leave at home. 4. Total time from sign-in until discharge is approximately 2-3 hours. 5. You should go home directly after your procedure and rest. You can resume normal activities the day after your procedure. 6. The day of your procedure you should not:  Drive  Make legal decisions  Operate machinery  Drink alcohol  Return to work   You may call the office (Dept: [phone]) before 5:00pm, or page the doctor on call ([phone]) after 5:00pm, for further instructions, if necessary.   Insurance Information YOU WILL NEED TO CHECK WITH YOUR INSURANCE COMPANY FOR THE BENEFITS OF COVERAGE YOU HAVE FOR THIS PROCEDURE.  UNFORTUNATELY, NOT ALL INSURANCE COMPANIES HAVE BENEFITS TO COVER ALL OR PART OF THESE TYPES OF PROCEDURES.  IT IS YOUR RESPONSIBILITY TO CHECK YOUR BENEFITS, HOWEVER, WE WILL BE GLAD TO ASSIST YOU WITH ANY CODES YOUR INSURANCE COMPANY MAY NEED.    PLEASE NOTE THAT MOST INSURANCE COMPANIES WILL NOT COVER A SCREENING COLONOSCOPY FOR PEOPLE UNDER THE AGE OF 50  IF YOU HAVE [employer] INSURANCE, YOU MAY HAVE BENEFITS FOR A SCREENING COLONOSCOPY BUT IF POLYPS ARE FOUND THE DIAGNOSIS WILL CHANGE AND THEN YOU MAY HAVE A DEDUCTIBLE THAT WILL NEED TO BE MET. SO PLEASE MAKE SURE YOU CHECK YOUR BENEFITS FOR A SCREENING COLONOSCOPY AS WELL AS A DIAGNOSTIC COLONOSCOPY.  

## 2017-08-09 NOTE — Progress Notes (Signed)
I called [facility]- spoke with Benton. She has sent me the pts current med list because pt and guardian were unsure of medications. I have updated med list in the chart and it is correct.

## 2017-08-09 NOTE — Progress Notes (Signed)
Gastroenterology Pre-Procedure Review  Request Date:08/09/17 Requesting Physician: 5 year recall Dr.Rourk- last tcs 08/14/12 tubular adenoma  PATIENT REVIEW QUESTIONS: The patient responded to the following health history questions as indicated:    1. Diabetes Melitis: no 2. Joint replacements in the past 12 months: no 3. Major health problems in the past 3 months: no 4. Has an artificial valve or MVP: no 5. Has a defibrillator: no 6. Has been advised in past to take antibiotics in advance of a procedure like teeth cleaning: no 7. Family history of colon cancer: no  8. Alcohol Use: yes (once a week ) 9. History of sleep apnea: no  10. History of coronary artery or other vascular stents placed within the last 12 months: no 11. History of any prior anesthesia complications: no    MEDICATIONS & ALLERGIES:    Patient reports the following regarding taking any blood thinners:   Plavix? no Aspirin? no Coumadin? no Brilinta? no Xarelto? no Eliquis? no Pradaxa? no Savaysa? no Effient? no  Patient confirms/reports the following medications:  Current Outpatient Medications  Medication Sig Dispense Refill  . acetaminophen (TYLENOL) 500 MG tablet Take 500 mg by mouth every 8 (eight) hours as needed for mild pain or moderate pain.    . ASPIRIN LOW DOSE 81 MG EC tablet TAKE 1 TABLET BY MOUTH ONCE A DAY. 30 tablet 3  . atorvastatin (LIPITOR) 20 MG tablet TAKE ONE TABLET BY MOUTH DAILY. 30 tablet 0  . carvedilol (COREG) 25 MG tablet TAKE 1 TABLET BY MOUTH TWICE A DAY. 60 tablet 6  . diclofenac sodium (VOLTAREN) 1 % GEL Apply 4 g topically 4 (four) times daily. Reported on 10/21/2015    . ENTRESTO 97-103 MG TAKE 1 TABLET BY MOUTH TWICE A DAY. 60 tablet 0  . hydrochlorothiazide (HYDRODIURIL) 12.5 MG tablet Take 1 tablet (12.5 mg total) by mouth daily.    . hydrochlorothiazide (MICROZIDE) 12.5 MG capsule TAKE 1 CAPSULE BY MOUTH DAILY. 30 capsule 6  . potassium chloride SA (K-DUR,KLOR-CON) 20 MEQ  tablet TAKE 1 TABLET BY MOUTH ONCE A DAY. 30 tablet 6  . potassium chloride SA (K-DUR,KLOR-CON) 20 MEQ tablet TAKE 1 TABLET BY MOUTH ONCE A DAY. 30 tablet 0  . potassium chloride SA (K-DUR,KLOR-CON) 20 MEQ tablet TAKE 1 TABLET BY MOUTH ONCE A DAY. 30 tablet 6   No current facility-administered medications for this visit.     Patient confirms/reports the following allergies:  No Known Allergies  No orders of the defined types were placed in this encounter.   AUTHORIZATION INFORMATION Primary Insurance medicare,  ID #: [id number] Pre-Cert / Auth required: no   Secondary Insurance: medicaid,  ID #: [id number] Pre-Cert / Josem Kaufmann required: no   SCHEDULE INFORMATION: Procedure has been scheduled as follows:  Date: 09/27/17, Time: 10:30 Location: [facility] Dr.Rourk  This Gastroenterology Pre-Precedure Review Form is being routed to the following provider(s):

## 2017-08-10 NOTE — Progress Notes (Signed)
Tried to call pt- NA 

## 2017-08-10 NOTE — Progress Notes (Signed)
Please ask how much he drinks once a week. If less than 3-4, ok to schedule. If more, message me back

## 2017-08-11 NOTE — Progress Notes (Signed)
Ok to schedule.

## 2017-08-11 NOTE — Progress Notes (Signed)
Pt said he has 1-2 drinks once a week.

## 2017-08-25 ENCOUNTER — Other Ambulatory Visit: Payer: Self-pay | Admitting: Cardiology

## 2017-09-07 ENCOUNTER — Encounter: Payer: Self-pay | Admitting: Cardiology

## 2017-09-07 ENCOUNTER — Ambulatory Visit (INDEPENDENT_AMBULATORY_CARE_PROVIDER_SITE_OTHER): Payer: Medicare Other | Admitting: Cardiology

## 2017-09-07 VITALS — BP 110/76 | HR 84 | Ht 69.0 in | Wt 161.0 lb

## 2017-09-07 DIAGNOSIS — I5022 Chronic systolic (congestive) heart failure: Secondary | ICD-10-CM | POA: Diagnosis not present

## 2017-09-07 DIAGNOSIS — I10 Essential (primary) hypertension: Secondary | ICD-10-CM | POA: Diagnosis not present

## 2017-09-07 DIAGNOSIS — E782 Mixed hyperlipidemia: Secondary | ICD-10-CM

## 2017-09-07 NOTE — Patient Instructions (Signed)
Medication Instructions:  Your physician recommends that you continue on your current medications as directed. Please refer to the Current Medication list given to you today.   Labwork: I WILL REQUEST LABS FROM PCP.   Testing/Procedures: Your physician has requested that you have an echocardiogram. Echocardiography is a painless test that uses sound waves to create images of your heart. It provides your doctor with information about the size and shape of your heart and how well your heart's chambers and valves are working. This procedure takes approximately one hour. There are no restrictions for this procedure.    Follow-Up: Your physician wants you to follow-up in: 6 MONTHS .  You will receive a reminder letter in the mail two months in advance. If you don't receive a letter, please call our office to schedule the follow-up appointment.   Any Other Special Instructions Will Be Listed Below (If Applicable).     If you need a refill on your cardiac medications before your next appointment, please call your pharmacy.   
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
## 2017-09-11 ENCOUNTER — Encounter: Payer: Self-pay | Admitting: Cardiology

## 2017-09-13 ENCOUNTER — Telehealth: Payer: Self-pay

## 2017-09-13 NOTE — Telephone Encounter (Signed)
Pt came into the office today with a paper from his cardiologist and his prep for his colonoscopy. He was very talkative and smelled of alcohol. I made him another copy of his instructions and tried to explain it to him and that the other papers he had were for his cardiologist. Pt still did not seem to understand. He kept talking about how he was not happy with his apartment and that I needed to talk to Anderson Malta, his Education officer, museum. I separated all of his paperwork and put his prep and colonoscopy instructions in a bag and labeled them for him. He decided to go home. Pt had told me prior to this that he only drinks a couple of drinks 1-2x a week. I called Anderson Malta, she said the pt drinks daily and will use any reason to drink more. I explained to her that if he is drinking daily then he will need to be done in the OR instead of endo and that we will need to bring him in for an office visit. She said she was prepared to help him with his prep.   She wants to know if she tells him to stop drinking for a week or so ( and she said he would stop for a week if she told him to), would he still be able to be done in endo? I told her that I would ask and call her back as soon as I know something.

## 2017-09-14 NOTE — Telephone Encounter (Signed)
Tried to call Anderson Malta at [employer]. Had to leave message on her voicemail. Advised her that we would have to cancel the pts current procedure and bring him in for an OV, and that we would call her with his OV appointment.  Erline Levine, please schedule ov and call Foye Spurling at [phone] ext [phone] with ov date and time.

## 2017-09-14 NOTE — Telephone Encounter (Signed)
With additional history, lets go with propofol.

## 2017-09-15 NOTE — Telephone Encounter (Signed)
SCHEDULED APPOINTMENT AND LEFT MESSAGE ON [employer] ANSWERING MACHINE

## 2017-09-15 NOTE — Telephone Encounter (Signed)
I have taken Gabriel Hammond off the schedule and called and spoke with Maudie Mercury and cancelled his procedure on 09/27/17.

## 2017-09-16 NOTE — Telephone Encounter (Signed)
Noted  

## 2017-09-21 ENCOUNTER — Ambulatory Visit (HOSPITAL_COMMUNITY)
Admission: RE | Admit: 2017-09-21 | Discharge: 2017-09-21 | Disposition: A | Discharge status: Home / Self Care | Payer: Medicare Other | Source: Ambulatory Visit | Attending: Cardiology | Admitting: Cardiology

## 2017-09-21 DIAGNOSIS — I351 Nonrheumatic aortic (valve) insufficiency: Secondary | ICD-10-CM | POA: Insufficient documentation

## 2017-09-21 DIAGNOSIS — F1020 Alcohol dependence, uncomplicated: Secondary | ICD-10-CM | POA: Insufficient documentation

## 2017-09-21 DIAGNOSIS — I5022 Chronic systolic (congestive) heart failure: Secondary | ICD-10-CM

## 2017-09-21 DIAGNOSIS — I429 Cardiomyopathy, unspecified: Secondary | ICD-10-CM | POA: Diagnosis not present

## 2017-09-21 NOTE — Progress Notes (Signed)
*  PRELIMINARY RESULTS* Echocardiogram 2D Echocardiogram has been performed.  Gabriel Hammond 09/21/2017, 10:22 AM

## 2017-09-26 ENCOUNTER — Other Ambulatory Visit: Payer: Self-pay | Admitting: Cardiology

## 2017-09-27 ENCOUNTER — Encounter (HOSPITAL_COMMUNITY): Payer: Self-pay

## 2017-09-27 ENCOUNTER — Ambulatory Visit (HOSPITAL_COMMUNITY): Admit: 2017-09-27 | Payer: Medicare Other | Admitting: Internal Medicine

## 2017-09-27 SURGERY — COLONOSCOPY
Anesthesia: Moderate Sedation

## 2017-10-25 ENCOUNTER — Other Ambulatory Visit: Payer: Self-pay | Admitting: Cardiology

## 2017-11-23 ENCOUNTER — Other Ambulatory Visit: Payer: Self-pay | Admitting: Cardiology

## 2017-11-30 ENCOUNTER — Ambulatory Visit: Payer: Self-pay | Admitting: Gastroenterology

## 2017-12-05 ENCOUNTER — Ambulatory Visit: Payer: Medicare Other | Admitting: Gastroenterology

## 2018-01-28 ENCOUNTER — Emergency Department (HOSPITAL_COMMUNITY): Payer: Medicare Other

## 2018-01-28 ENCOUNTER — Emergency Department (HOSPITAL_COMMUNITY)
Admission: EM | Admit: 2018-01-28 | Discharge: 2018-01-29 | Disposition: A | Discharge status: Home / Self Care | Payer: Medicare Other | Attending: Emergency Medicine | Admitting: Emergency Medicine

## 2018-01-28 ENCOUNTER — Encounter (HOSPITAL_COMMUNITY): Payer: Self-pay

## 2018-01-28 DIAGNOSIS — M25512 Pain in left shoulder: Secondary | ICD-10-CM | POA: Diagnosis not present

## 2018-01-28 DIAGNOSIS — S0081XA Abrasion of other part of head, initial encounter: Secondary | ICD-10-CM

## 2018-01-28 DIAGNOSIS — W19XXXA Unspecified fall, initial encounter: Secondary | ICD-10-CM

## 2018-01-28 DIAGNOSIS — Z79899 Other long term (current) drug therapy: Secondary | ICD-10-CM | POA: Diagnosis not present

## 2018-01-28 DIAGNOSIS — Y999 Unspecified external cause status: Secondary | ICD-10-CM | POA: Diagnosis not present

## 2018-01-28 DIAGNOSIS — Y9289 Other specified places as the place of occurrence of the external cause: Secondary | ICD-10-CM | POA: Diagnosis not present

## 2018-01-28 DIAGNOSIS — S299XXA Unspecified injury of thorax, initial encounter: Secondary | ICD-10-CM | POA: Diagnosis not present

## 2018-01-28 DIAGNOSIS — Y908 Blood alcohol level of 240 mg/100 ml or more: Secondary | ICD-10-CM | POA: Diagnosis not present

## 2018-01-28 DIAGNOSIS — M25511 Pain in right shoulder: Secondary | ICD-10-CM | POA: Diagnosis not present

## 2018-01-28 DIAGNOSIS — S0990XA Unspecified injury of head, initial encounter: Secondary | ICD-10-CM | POA: Diagnosis not present

## 2018-01-28 DIAGNOSIS — Z7982 Long term (current) use of aspirin: Secondary | ICD-10-CM | POA: Insufficient documentation

## 2018-01-28 DIAGNOSIS — I10 Essential (primary) hypertension: Secondary | ICD-10-CM | POA: Diagnosis not present

## 2018-01-28 DIAGNOSIS — S4992XA Unspecified injury of left shoulder and upper arm, initial encounter: Secondary | ICD-10-CM | POA: Diagnosis not present

## 2018-01-28 DIAGNOSIS — Z23 Encounter for immunization: Secondary | ICD-10-CM | POA: Diagnosis not present

## 2018-01-28 DIAGNOSIS — S199XXA Unspecified injury of neck, initial encounter: Secondary | ICD-10-CM | POA: Diagnosis not present

## 2018-01-28 DIAGNOSIS — T07XXXA Unspecified multiple injuries, initial encounter: Secondary | ICD-10-CM

## 2018-01-28 DIAGNOSIS — Z87891 Personal history of nicotine dependence: Secondary | ICD-10-CM | POA: Diagnosis not present

## 2018-01-28 DIAGNOSIS — F10920 Alcohol use, unspecified with intoxication, uncomplicated: Secondary | ICD-10-CM

## 2018-01-28 DIAGNOSIS — R220 Localized swelling, mass and lump, head: Secondary | ICD-10-CM | POA: Diagnosis not present

## 2018-01-28 DIAGNOSIS — Y939 Activity, unspecified: Secondary | ICD-10-CM | POA: Diagnosis not present

## 2018-01-28 DIAGNOSIS — S0993XA Unspecified injury of face, initial encounter: Secondary | ICD-10-CM | POA: Diagnosis not present

## 2018-01-28 DIAGNOSIS — R079 Chest pain, unspecified: Secondary | ICD-10-CM | POA: Diagnosis not present

## 2018-01-28 LAB — COMPREHENSIVE METABOLIC PANEL
ALT: 17 U/L (ref 0–44)
AST: 22 U/L (ref 15–41)
Albumin: 4.1 g/dL (ref 3.5–5.0)
Alkaline Phosphatase: 60 U/L (ref 38–126)
Anion gap: 8 (ref 5–15)
BUN: 15 mg/dL (ref 8–23)
CO2: 26 mmol/L (ref 22–32)
Calcium: 8.9 mg/dL (ref 8.9–10.3)
Chloride: 108 mmol/L (ref 98–111)
Creatinine, Ser: 0.82 mg/dL (ref 0.61–1.24)
GFR calc Af Amer: >60 mL/min (ref >60)
GFR calc non Af Amer: >60 mL/min (ref >60)
Glucose, Bld: 90 mg/dL (ref 70–99)
Potassium: 4 mmol/L (ref 3.5–5.1)
Sodium: 142 mmol/L (ref 135–145)
Total Bilirubin: 0.5 mg/dL (ref 0.3–1.2)
Total Protein: 7.6 g/dL (ref 6.5–8.1)

## 2018-01-28 LAB — CBC WITH DIFFERENTIAL/PLATELET
Basophils Absolute: 0 10*3/uL (ref 0.0–0.1)
Basophils Relative: 1 %
Eosinophils Absolute: 0.1 10*3/uL (ref 0.0–0.7)
Eosinophils Relative: 2 %
HCT: 34.2 % — ABNORMAL LOW (ref 39.0–52.0)
Hemoglobin: 11 g/dL — ABNORMAL LOW (ref 13.0–17.0)
Lymphocytes Relative: 30 %
Lymphs Abs: 1 10*3/uL (ref 0.7–4.0)
MCH: 28.7 pg (ref 26.0–34.0)
MCHC: 32.2 g/dL (ref 30.0–36.0)
MCV: 89.3 fL (ref 78.0–100.0)
Monocytes Absolute: 0.2 10*3/uL (ref 0.1–1.0)
Monocytes Relative: 5 %
Neutro Abs: 2.1 10*3/uL (ref 1.7–7.7)
Neutrophils Relative %: 62 %
Platelets: 311 10*3/uL (ref 150–400)
RBC: 3.83 MIL/uL — ABNORMAL LOW (ref 4.22–5.81)
RDW: 15.4 % (ref 11.5–15.5)
WBC: 3.4 10*3/uL — ABNORMAL LOW (ref 4.0–10.5)

## 2018-01-28 LAB — ETHANOL: Alcohol, Ethyl (B): 263 mg/dL — ABNORMAL HIGH (ref <10)

## 2018-01-28 IMAGING — DX DG CHEST 2V
2 series · 2 of 2 positions shown · non-contrast
Comparison: None.

CLINICAL DATA: Fall, right shoulder and chest pain.

EXAM:
CHEST - 2 VIEW

[chest pa]
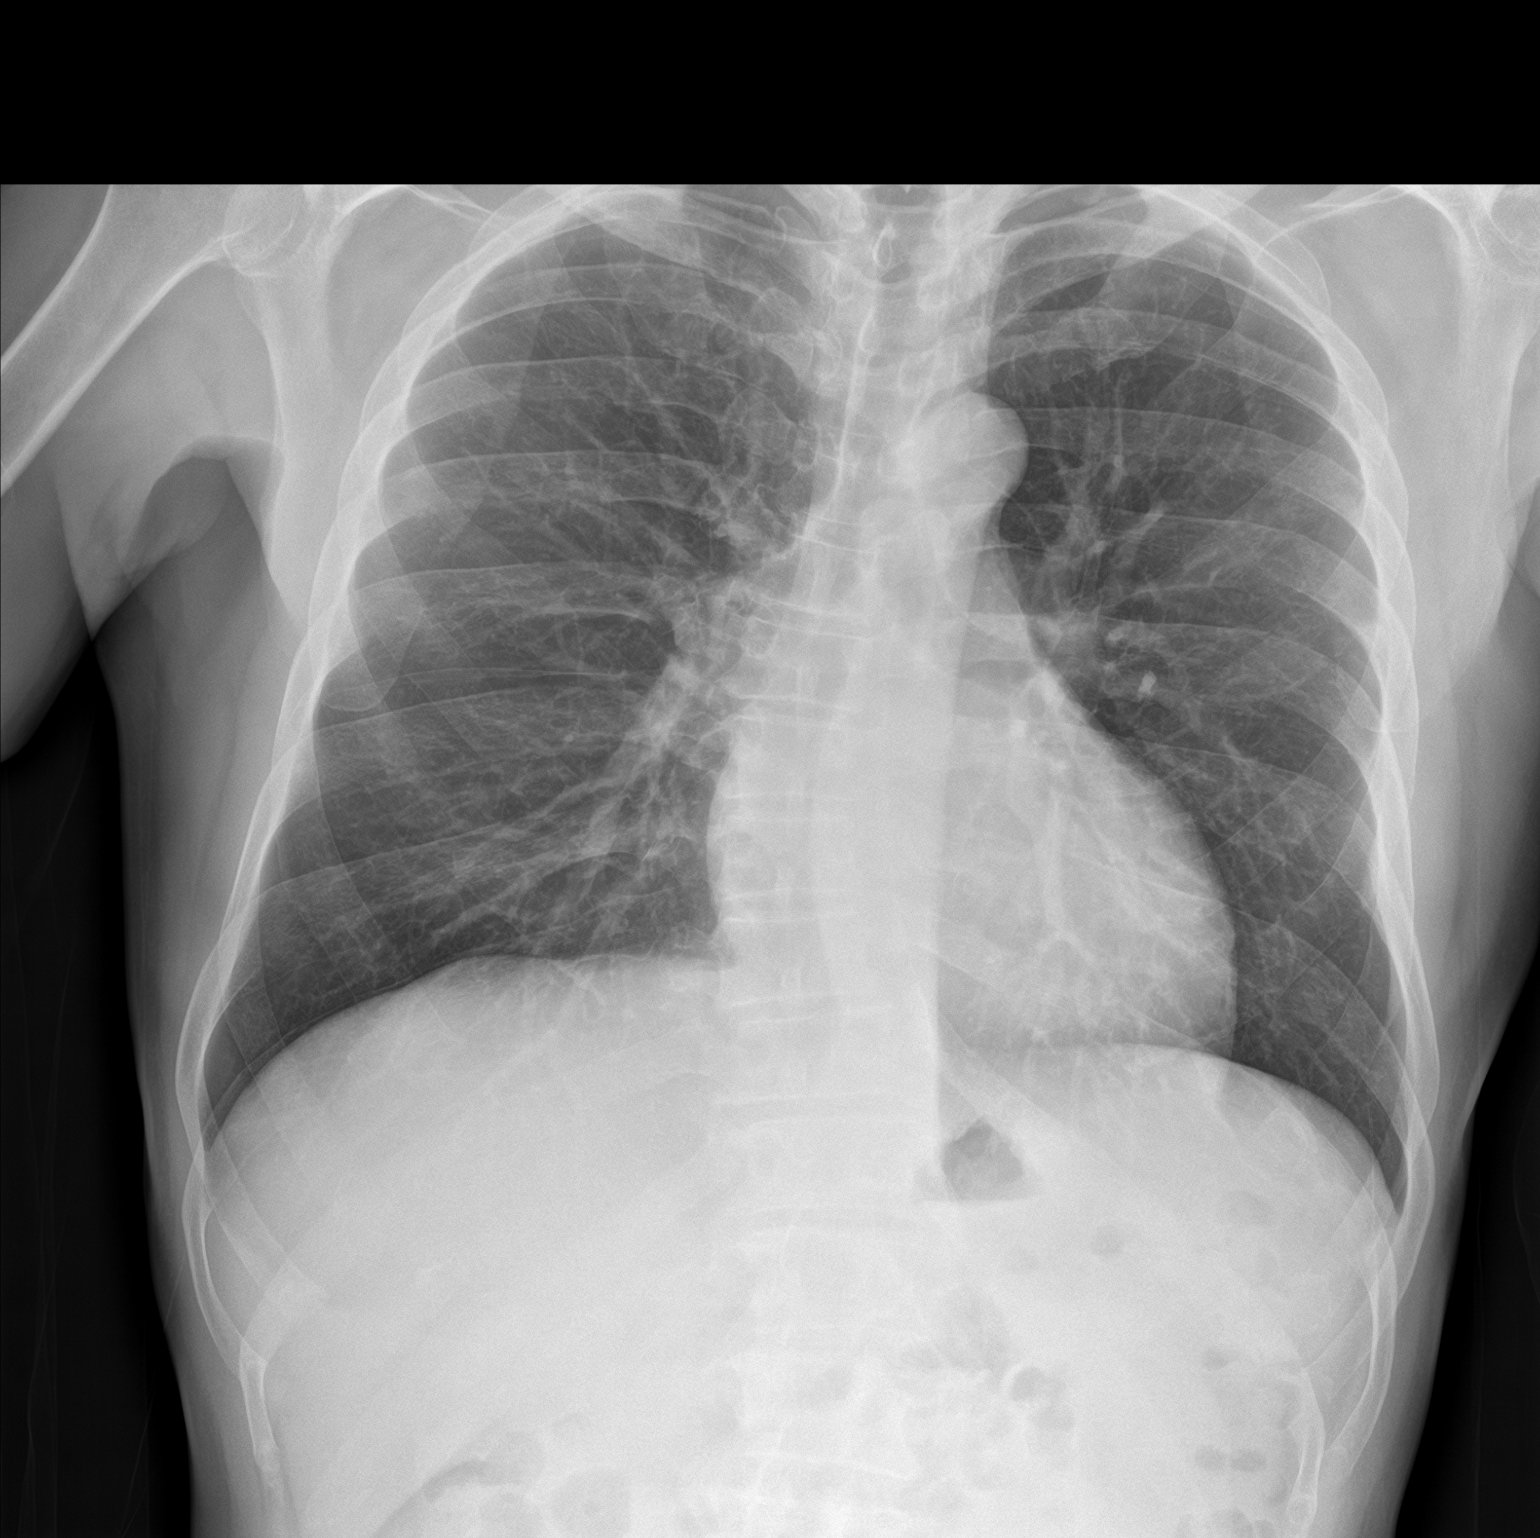

[chest lat]
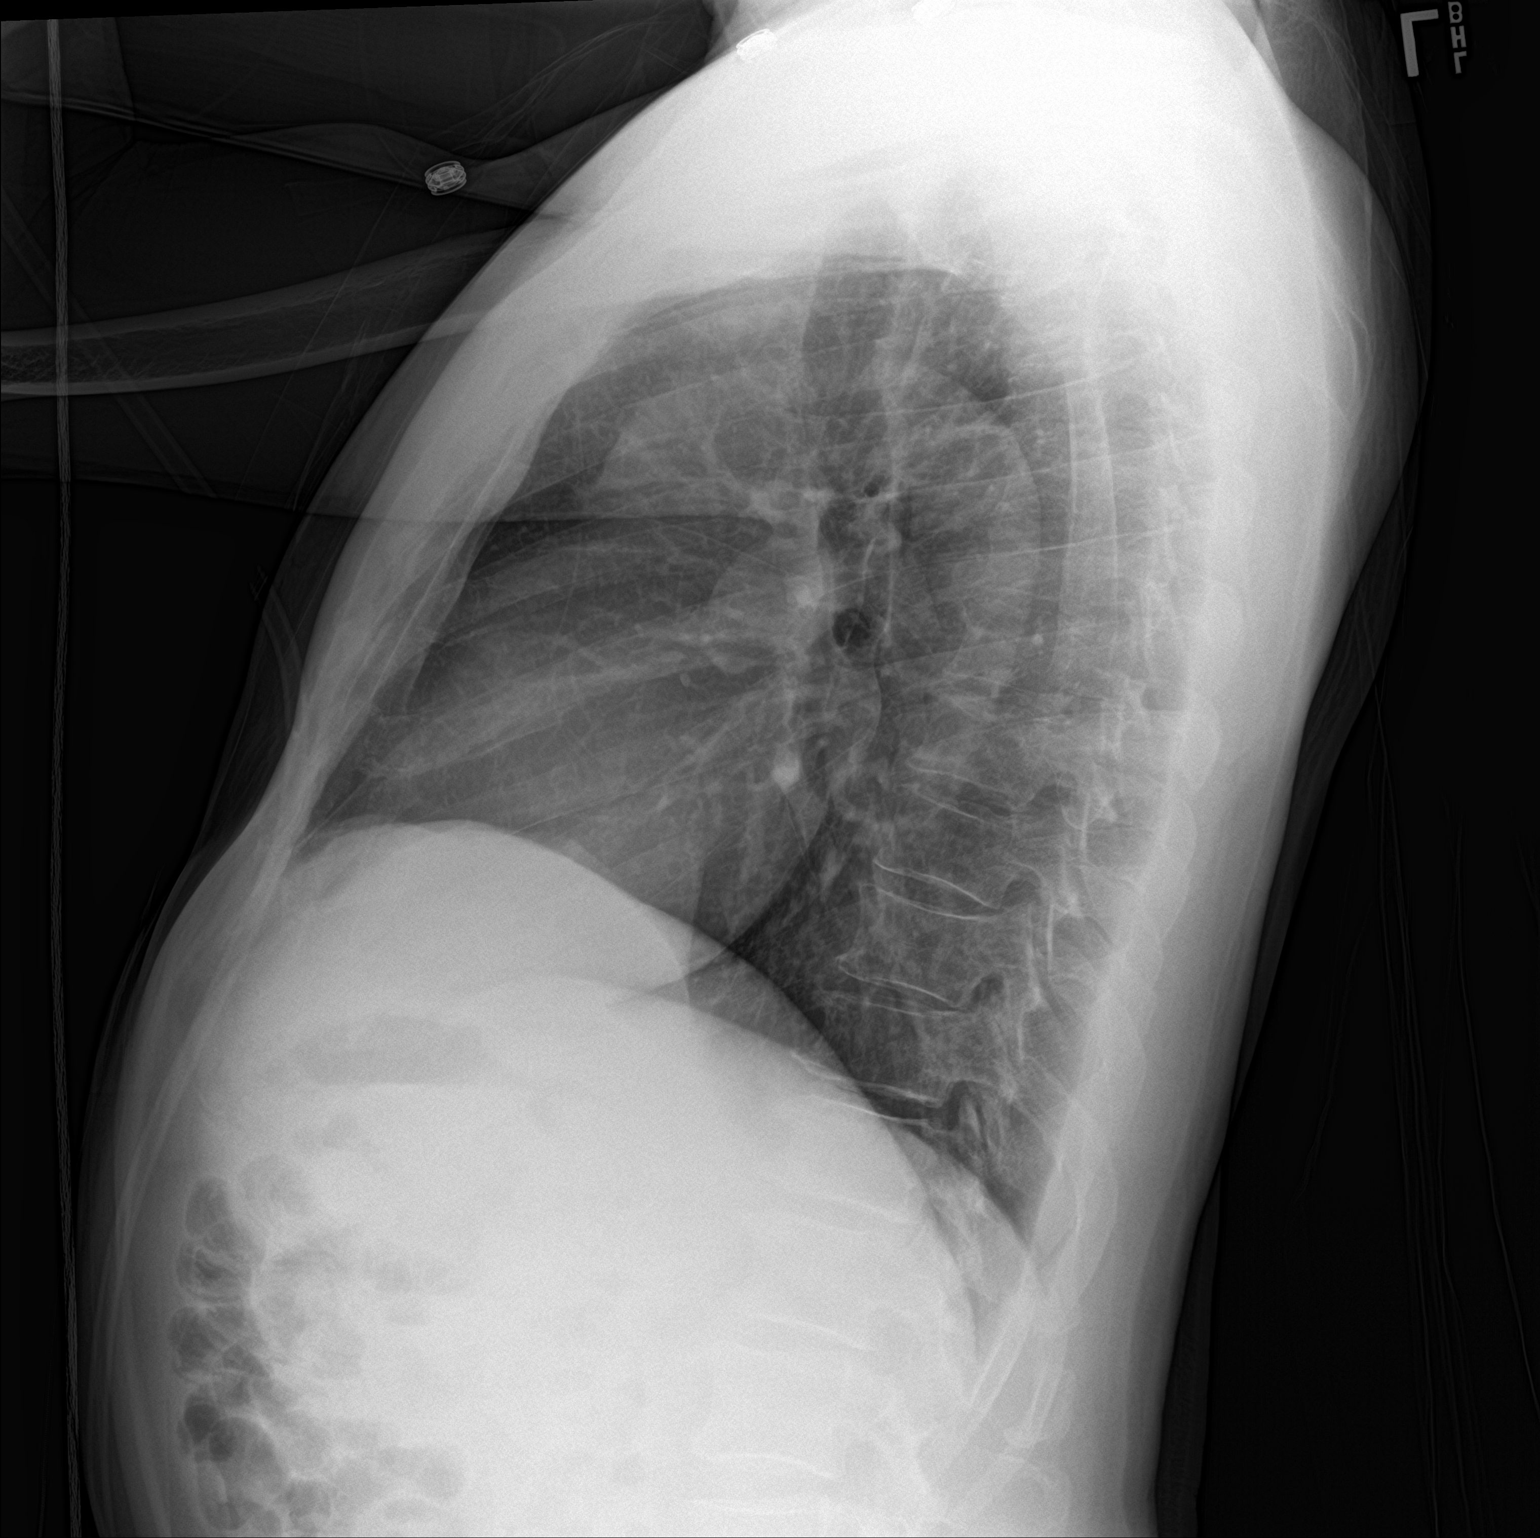

[2 of 2 positions shown; findings below may reference images not displayed]

FINDINGS: The heart size and mediastinal contours are within normal limits.
Both lungs are clear. The visualized skeletal structures are
unremarkable.
IMPRESSION: No active cardiopulmonary disease.

## 2018-01-28 MED ORDER — BACITRACIN ZINC 500 UNIT/GM EX OINT
TOPICAL_OINTMENT | CUTANEOUS | Status: AC
  Administered 2018-01-28: 1
  Filled 2018-01-28: qty 0.9

## 2018-01-28 MED ORDER — TETANUS-DIPHTH-ACELL PERTUSSIS 5-2.5-18.5 LF-MCG/0.5 IM SUSP
0.5000 mL | Freq: Once | INTRAMUSCULAR | Status: AC
  Administered 2018-01-28: 0.5 mL via INTRAMUSCULAR
  Filled 2018-01-28: qty 0.5

## 2018-01-28 NOTE — ED Notes (Signed)
Pt with abraised area to left cheek and left chin
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
## 2018-01-28 NOTE — ED Notes (Signed)
Pt changed into dry gown and warm blanket given

## 2018-01-28 NOTE — ED Notes (Signed)
Per Lucky Rathke, MD, when Pt is able to ambulate and feels more capable of sounder mind, Pt may be discharged and leave.

## 2018-01-28 NOTE — ED Notes (Signed)
Patient transported to X-ray 

## 2018-01-28 NOTE — ED Triage Notes (Addendum)
Pt brought in by EMS due fall. Pt reports he slipped and leg/foot gave out. Pt has abrasions on face and right thumb. Pt complaining of left shoulder pain and has red area to left shoulder  Pt stated he laid outside possibly 2 hrs in rain

## 2018-01-28 NOTE — ED Notes (Signed)
ED Provider at bedside. 

## 2018-01-28 NOTE — ED Notes (Signed)
Pt transported to CT and x-ray  

## 2018-01-29 NOTE — Discharge Instructions (Addendum)
Avoid all forms of alcohol.  Clean wounds in your face with soap and water twice a day.  For pain, use ibuprofen or Tylenol.  Return here or see your doctor for problems.

## 2018-01-30 ENCOUNTER — Other Ambulatory Visit (HOSPITAL_COMMUNITY): Payer: Self-pay | Admitting: Emergency Medicine

## 2018-01-30 ENCOUNTER — Ambulatory Visit (HOSPITAL_COMMUNITY)
Admission: RE | Admit: 2018-01-30 | Discharge: 2018-01-30 | Disposition: A | Discharge status: Home / Self Care | Payer: Medicare Other | Source: Ambulatory Visit | Attending: Emergency Medicine | Admitting: Emergency Medicine

## 2018-01-30 DIAGNOSIS — Z9181 History of falling: Secondary | ICD-10-CM | POA: Diagnosis not present

## 2018-01-30 DIAGNOSIS — M19011 Primary osteoarthritis, right shoulder: Secondary | ICD-10-CM | POA: Insufficient documentation

## 2018-01-31 ENCOUNTER — Other Ambulatory Visit: Payer: Self-pay | Admitting: Cardiology

## 2018-03-06 ENCOUNTER — Encounter: Payer: Self-pay | Admitting: Gastroenterology

## 2018-03-06 ENCOUNTER — Telehealth: Payer: Self-pay

## 2018-03-06 ENCOUNTER — Ambulatory Visit (INDEPENDENT_AMBULATORY_CARE_PROVIDER_SITE_OTHER): Payer: Medicare Other | Admitting: Gastroenterology

## 2018-03-06 ENCOUNTER — Other Ambulatory Visit: Payer: Self-pay

## 2018-03-06 ENCOUNTER — Other Ambulatory Visit: Payer: Self-pay | Admitting: Cardiology

## 2018-03-06 VITALS — BP 122/74 | HR 79 | Temp 97.0°F | Ht 69.0 in | Wt 146.4 lb

## 2018-03-06 DIAGNOSIS — D649 Anemia, unspecified: Secondary | ICD-10-CM

## 2018-03-06 DIAGNOSIS — F1020 Alcohol dependence, uncomplicated: Secondary | ICD-10-CM | POA: Diagnosis not present

## 2018-03-06 DIAGNOSIS — Z86010 Personal history of colonic polyps: Secondary | ICD-10-CM | POA: Diagnosis not present

## 2018-03-06 DIAGNOSIS — Z860101 Personal history of adenomatous and serrated colon polyps: Secondary | ICD-10-CM | POA: Insufficient documentation

## 2018-03-06 DIAGNOSIS — Z800 Family history of malignant neoplasm of digestive organs: Secondary | ICD-10-CM | POA: Diagnosis not present

## 2018-03-06 DIAGNOSIS — K760 Fatty (change of) liver, not elsewhere classified: Secondary | ICD-10-CM

## 2018-03-06 MED ORDER — NA SULFATE-K SULFATE-MG SULF 17.5-3.13-1.6 GM/177ML PO SOLN: 1.0000 | ORAL | 0 refills | Status: DC

## 2018-03-06 NOTE — Patient Instructions (Signed)
1. Colonoscopy as scheduled. See separate instructions.  2. Return to the office in 6 months to follow up fatty liver, anemia.

## 2018-03-06 NOTE — Telephone Encounter (Signed)
Tried to call pt's social worker Anderson Malta Reiter) to inform of pre-op appt 04/05/18 at 12:45pm, no answer, LMOVM. Letter mailed to pt.

## 2018-03-06 NOTE — Assessment & Plan Note (Signed)
Due for 5-year surveillance colonoscopy for history of adenomatous colon polyps, family history of colon cancer.  Given Chronic alcohol use, plan for deep sedation. THERE IS CONCERN FOR POSSIBLE DRUG USE PER SOCIAL WORKER BUT PATIENT DENIES. CONSIDER URINE DRUG SCREEN DURING PRE-OP. I have discussed the risks, alternatives, benefits with regards to but not limited to the risk of reaction to medication, bleeding, infection, perforation and the patient is agreeable to proceed. Written consent to be obtained.

## 2018-03-06 NOTE — Assessment & Plan Note (Signed)
Continue to recommend alcohol cessation.  Continue to follow LFTs.  Come back in 6 months for follow-up.

## 2018-03-06 NOTE — Progress Notes (Signed)
CC'D TO PCP °
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
## 2018-03-06 NOTE — Assessment & Plan Note (Signed)
Chronic normocytic anemia in the setting of alcohol abuse.  Colonoscopy planned for history of colon polyps as outlined.  Continue to follow.

## 2018-03-10 ENCOUNTER — Ambulatory Visit: Payer: Self-pay | Admitting: Cardiology

## 2018-03-29 NOTE — Patient Instructions (Signed)
Gabriel Hammond  03/29/2018     @PREFPERIOPPHARMACY @   Your procedure is scheduled on  04/13/2018.  Report to [facility] at  845  A.M.  Call this number if you have problems the morning of surgery:  [phone]   Remember:  Follow the diet and prep instructions given to you by Dr Roseanne Kaufman office.                    Take these medicines the morning of surgery with A SIP OF WATER  Carvedilol, entresto.    Do not wear jewelry, make-up or nail polish.  Do not wear lotions, powders, or perfumes, or deodorant.  Do not shave 48 hours prior to surgery.  Men may shave face and neck.  Do not bring valuables to the hospital.  [facility] is not responsible for any belongings or valuables.  Contacts, dentures or bridgework may not be worn into surgery.  Leave your suitcase in the car.  After surgery it may be brought to your room.  For patients admitted to the hospital, discharge time will be determined by your treatment team.  Patients discharged the day of surgery will not be allowed to drive home.   Name and phone number of your driver:   family Special instructions:  None  Please read over the following fact sheets that you were given. Anesthesia Post-op Instructions and Care and Recovery After Surgery       Colonoscopy, Adult A colonoscopy is an exam to look at the large intestine. It is done to check for problems, such as:  Lumps (tumors).  Growths (polyps).  Swelling (inflammation).  Bleeding.  What happens before the procedure? Eating and drinking Follow instructions from your doctor about eating and drinking. These instructions may include:  A few days before the procedure - follow a low-fiber diet. ? Avoid nuts. ? Avoid seeds. ? Avoid dried fruit. ? Avoid raw fruits. ? Avoid vegetables.  1-3 days before the procedure - follow a clear liquid diet. Avoid liquids that have red or purple dye. Drink only clear liquids, such as: ? Clear broth or  bouillon. ? Black coffee or tea. ? Clear juice. ? Clear soft drinks or sports drinks. ? Gelatin dessert. ? Popsicles.  On the day of the procedure - do not eat or drink anything during the 2 hours before the procedure.  Bowel prep If you were prescribed an oral bowel prep:  Take it as told by your doctor. Starting the day before your procedure, you will need to drink a lot of liquid. The liquid will cause you to poop (have bowel movements) until your poop is almost clear or light green.  If your skin or butt gets irritated from diarrhea, you may: ? Wipe the area with wipes that have medicine in them, such as adult wet wipes with aloe and vitamin E. ? Put something on your skin that soothes the area, such as petroleum jelly.  If you throw up (vomit) while drinking the bowel prep, take a break for up to 60 minutes. Then begin the bowel prep again. If you keep throwing up and you cannot take the bowel prep without throwing up, call your doctor.  General instructions  Ask your doctor about changing or stopping your normal medicines. This is important if you take diabetes medicines or blood thinners.  Plan to have someone take you home from the hospital or clinic. What  happens during the procedure?  An IV tube may be put into one of your veins.  You will be given medicine to help you relax (sedative).  To reduce your risk of infection: ? Your doctors will wash their hands. ? Your anal area will be washed with soap.  You will be asked to lie on your side with your knees bent.  Your doctor will get a long, thin, flexible tube ready. The tube will have a camera and a light on the end.  The tube will be put into your anus.  The tube will be gently put into your large intestine.  Air will be delivered into your large intestine to keep it open. You may feel some pressure or cramping.  The camera will be used to take photos.  A small tissue sample may be removed from your body to  be looked at under a microscope (biopsy). If any possible problems are found, the tissue will be sent to a lab for testing.  If small growths are found, your doctor may remove them and have them checked for cancer.  The tube that was put into your anus will be slowly removed. The procedure may vary among doctors and hospitals. What happens after the procedure?  Your doctor will check on you often until the medicines you were given have worn off.  Do not drive for 24 hours after the procedure.  You may have a small amount of blood in your poop.  You may pass gas.  You may have mild cramps or bloating in your belly (abdomen).  It is up to you to get the results of your procedure. Ask your doctor, or the department performing the procedure, when your results will be ready. This information is not intended to replace advice given to you by your health care provider. Make sure you discuss any questions you have with your health care provider. Document Released: 06/26/2010 Document Revised: 03/24/2016 Document Reviewed: 08/05/2015 Elsevier Interactive Patient Education  2017 [employer]  Colonoscopy, Adult, Care After This sheet gives you information about how to care for yourself after your procedure. Your health care provider may also give you more specific instructions. If you have problems or questions, contact your health care provider. What can I expect after the procedure? After the procedure, it is common to have:  A small amount of blood in your stool for 24 hours after the procedure.  Some gas.  Mild abdominal cramping or bloating.  Follow these instructions at home: General instructions   For the first 24 hours after the procedure: ? Do not drive or use machinery. ? Do not sign important documents. ? Do not drink alcohol. ? Do your regular daily activities at a slower pace than normal. ? Eat soft, easy-to-digest foods. ? Rest often.  Take over-the-counter or  prescription medicines only as told by your health care provider.  It is up to you to get the results of your procedure. Ask your health care provider, or the department performing the procedure, when your results will be ready. Relieving cramping and bloating  Try walking around when you have cramps or feel bloated.  Apply heat to your abdomen as told by your health care provider. Use a heat source that your health care provider recommends, such as a moist heat pack or a heating pad. ? Place a towel between your skin and the heat source. ? Leave the heat on for 20-30 minutes. ? Remove the heat if your skin  turns bright red. This is especially important if you are unable to feel pain, heat, or cold. You may have a greater risk of getting burned. Eating and drinking  Drink enough fluid to keep your urine clear or pale yellow.  Resume your normal diet as instructed by your health care provider. Avoid heavy or fried foods that are hard to digest.  Avoid drinking alcohol for as long as instructed by your health care provider. Contact a health care provider if:  You have blood in your stool 2-3 days after the procedure. Get help right away if:  You have more than a small spotting of blood in your stool.  You pass large blood clots in your stool.  Your abdomen is swollen.  You have nausea or vomiting.  You have a fever.  You have increasing abdominal pain that is not relieved with medicine. This information is not intended to replace advice given to you by your health care provider. Make sure you discuss any questions you have with your health care provider. Document Released: 01/06/2004 Document Revised: 02/16/2016 Document Reviewed: 08/05/2015 Elsevier Interactive Patient Education  2018 [facility] Anesthesia is a term that refers to techniques, procedures, and medicines that help a person stay safe and comfortable during a medical procedure.  Monitored anesthesia care, or sedation, is one type of anesthesia. Your anesthesia specialist may recommend sedation if you will be having a procedure that does not require you to be unconscious, such as:  Cataract surgery.  A dental procedure.  A biopsy.  A colonoscopy.  During the procedure, you may receive a medicine to help you relax (sedative). There are three levels of sedation:  Mild sedation. At this level, you may feel awake and relaxed. You will be able to follow directions.  Moderate sedation. At this level, you will be sleepy. You may not remember the procedure.  Deep sedation. At this level, you will be asleep. You will not remember the procedure.  The more medicine you are given, the deeper your level of sedation will be. Depending on how you respond to the procedure, the anesthesia specialist may change your level of sedation or the type of anesthesia to fit your needs. An anesthesia specialist will monitor you closely during the procedure. Let your health care provider know about:  Any allergies you have.  All medicines you are taking, including vitamins, herbs, eye drops, creams, and over-the-counter medicines.  Any use of steroids (by mouth or as a cream).  Any problems you or family members have had with sedatives and anesthetic medicines.  Any blood disorders you have.  Any surgeries you have had.  Any medical conditions you have, such as sleep apnea.  Whether you are pregnant or may be pregnant.  Any use of cigarettes, alcohol, or street drugs. What are the risks? Generally, this is a safe procedure. However, problems may occur, including:  Getting too much medicine (oversedation).  Nausea.  Allergic reaction to medicines.  Trouble breathing. If this happens, a breathing tube may be used to help with breathing. It will be removed when you are awake and breathing on your own.  Heart trouble.  Lung trouble.  Before the procedure Staying  hydrated Follow instructions from your health care provider about hydration, which may include:  Up to 2 hours before the procedure - you may continue to drink clear liquids, such as water, clear fruit juice, black coffee, and plain tea.  Eating and drinking restrictions Follow instructions  from your health care provider about eating and drinking, which may include:  8 hours before the procedure - stop eating heavy meals or foods such as meat, fried foods, or fatty foods.  6 hours before the procedure - stop eating light meals or foods, such as toast or cereal.  6 hours before the procedure - stop drinking milk or drinks that contain milk.  2 hours before the procedure - stop drinking clear liquids.  Medicines Ask your health care provider about:  Changing or stopping your regular medicines. This is especially important if you are taking diabetes medicines or blood thinners.  Taking medicines such as aspirin and ibuprofen. These medicines can thin your blood. Do not take these medicines before your procedure if your health care provider instructs you not to.  Tests and exams  You will have a physical exam.  You may have blood tests done to show: ? How well your kidneys and liver are working. ? How well your blood can clot.  General instructions  Plan to have someone take you home from the hospital or clinic.  If you will be going home right after the procedure, plan to have someone with you for 24 hours.  What happens during the procedure?  Your blood pressure, heart rate, breathing, level of pain and overall condition will be monitored.  An IV tube will be inserted into one of your veins.  Your anesthesia specialist will give you medicines as needed to keep you comfortable during the procedure. This may mean changing the level of sedation.  The procedure will be performed. After the procedure  Your blood pressure, heart rate, breathing rate, and blood oxygen level  will be monitored until the medicines you were given have worn off.  Do not drive for 24 hours if you received a sedative.  You may: ? Feel sleepy, clumsy, or nauseous. ? Feel forgetful about what happened after the procedure. ? Have a sore throat if you had a breathing tube during the procedure. ? Vomit. This information is not intended to replace advice given to you by your health care provider. Make sure you discuss any questions you have with your health care provider. Document Released: 02/17/2005 Document Revised: 10/31/2015 Document Reviewed: 09/14/2015 Elsevier Interactive Patient Education  2018 [place], Care After These instructions provide you with information about caring for yourself after your procedure. Your health care provider may also give you more specific instructions. Your treatment has been planned according to current medical practices, but problems sometimes occur. Call your health care provider if you have any problems or questions after your procedure. What can I expect after the procedure? After your procedure, it is common to:  Feel sleepy for several hours.  Feel clumsy and have poor balance for several hours.  Feel forgetful about what happened after the procedure.  Have poor judgment for several hours.  Feel nauseous or vomit.  Have a sore throat if you had a breathing tube during the procedure.  Follow these instructions at home: For at least 24 hours after the procedure:   Do not: ? Participate in activities in which you could fall or become injured. ? Drive. ? Use heavy machinery. ? Drink alcohol. ? Take sleeping pills or medicines that cause drowsiness. ? Make important decisions or sign legal documents. ? Take care of children on your own.  Rest. Eating and drinking  Follow the diet that is recommended by your health care provider.  If you  vomit, drink water, juice, or soup when you can drink without  vomiting.  Make sure you have little or no nausea before eating solid foods. General instructions  Have a responsible adult stay with you until you are awake and alert.  Take over-the-counter and prescription medicines only as told by your health care provider.  If you smoke, do not smoke without supervision.  Keep all follow-up visits as told by your health care provider. This is important. Contact a health care provider if:  You keep feeling nauseous or you keep vomiting.  You feel light-headed.  You develop a rash.  You have a fever. Get help right away if:  You have trouble breathing. This information is not intended to replace advice given to you by your health care provider. Make sure you discuss any questions you have with your health care provider. Document Released: 09/14/2015 Document Revised: 01/14/2016 Document Reviewed: 09/14/2015 Elsevier Interactive Patient Education  [employer].

## 2018-04-05 ENCOUNTER — Other Ambulatory Visit: Payer: Self-pay

## 2018-04-05 ENCOUNTER — Encounter (HOSPITAL_COMMUNITY)
Admission: RE | Admit: 2018-04-05 | Discharge: 2018-04-05 | Disposition: A | Discharge status: Home / Self Care | Payer: Medicare Other | Source: Ambulatory Visit | Attending: Internal Medicine | Admitting: Internal Medicine

## 2018-04-05 ENCOUNTER — Encounter (HOSPITAL_COMMUNITY): Payer: Self-pay

## 2018-04-05 DIAGNOSIS — Z01818 Encounter for other preprocedural examination: Secondary | ICD-10-CM | POA: Insufficient documentation

## 2018-04-05 HISTORY — DX: Unspecified osteoarthritis, unspecified site: M19.90

## 2018-04-05 LAB — CBC WITH DIFFERENTIAL/PLATELET
Abs Immature Granulocytes: 0.01 10*3/uL (ref 0.00–0.07)
Basophils Absolute: 0 10*3/uL (ref 0.0–0.1)
Basophils Relative: 1 %
Eosinophils Absolute: 0 10*3/uL (ref 0.0–0.5)
Eosinophils Relative: 2 %
HCT: 35.7 % — ABNORMAL LOW (ref 39.0–52.0)
Hemoglobin: 11.4 g/dL — ABNORMAL LOW (ref 13.0–17.0)
Immature Granulocytes: 0 %
Lymphocytes Relative: 25 %
Lymphs Abs: 0.6 10*3/uL — ABNORMAL LOW (ref 0.7–4.0)
MCH: 30.2 pg (ref 26.0–34.0)
MCHC: 31.9 g/dL (ref 30.0–36.0)
MCV: 94.7 fL (ref 80.0–100.0)
Monocytes Absolute: 0.3 10*3/uL (ref 0.1–1.0)
Monocytes Relative: 14 %
Neutro Abs: 1.4 10*3/uL — ABNORMAL LOW (ref 1.7–7.7)
Neutrophils Relative %: 58 %
Platelets: 224 10*3/uL (ref 150–400)
RBC: 3.77 MIL/uL — ABNORMAL LOW (ref 4.22–5.81)
RDW: 17 % — ABNORMAL HIGH (ref 11.5–15.5)
WBC: 2.5 10*3/uL — ABNORMAL LOW (ref 4.0–10.5)
nRBC: 0 % (ref 0.0–0.2)

## 2018-04-05 LAB — BASIC METABOLIC PANEL
Anion gap: 10 (ref 5–15)
BUN: 15 mg/dL (ref 8–23)
CO2: 20 mmol/L — ABNORMAL LOW (ref 22–32)
Calcium: 9.2 mg/dL (ref 8.9–10.3)
Chloride: 108 mmol/L (ref 98–111)
Creatinine, Ser: 0.7 mg/dL (ref 0.61–1.24)
GFR calc Af Amer: >60 mL/min (ref >60)
GFR calc non Af Amer: >60 mL/min (ref >60)
Glucose, Bld: 81 mg/dL (ref 70–99)
Potassium: 4.2 mmol/L (ref 3.5–5.1)
Sodium: 138 mmol/L (ref 135–145)

## 2018-04-06 ENCOUNTER — Ambulatory Visit (INDEPENDENT_AMBULATORY_CARE_PROVIDER_SITE_OTHER): Payer: Medicare Other | Admitting: Student

## 2018-04-06 ENCOUNTER — Encounter: Payer: Self-pay | Admitting: Student

## 2018-04-06 VITALS — BP 138/74 | HR 88 | Ht 69.0 in | Wt 147.0 lb

## 2018-04-06 DIAGNOSIS — E782 Mixed hyperlipidemia: Secondary | ICD-10-CM | POA: Diagnosis not present

## 2018-04-06 DIAGNOSIS — Z8679 Personal history of other diseases of the circulatory system: Secondary | ICD-10-CM

## 2018-04-06 DIAGNOSIS — F1010 Alcohol abuse, uncomplicated: Secondary | ICD-10-CM

## 2018-04-06 DIAGNOSIS — I5042 Chronic combined systolic (congestive) and diastolic (congestive) heart failure: Secondary | ICD-10-CM | POA: Diagnosis not present

## 2018-04-06 DIAGNOSIS — I10 Essential (primary) hypertension: Secondary | ICD-10-CM
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
## 2018-04-06 NOTE — Patient Instructions (Signed)
Medication Instructions:  Your physician recommends that you continue on your current medications as directed. Please refer to the Current Medication list given to you today.  If you need a refill on your cardiac medications before your next appointment, please call your pharmacy.   Lab work: NONE If you have labs (blood work) drawn today and your tests are completely normal, you will receive your results only by: Marland Kitchen MyChart Message (if you have MyChart) OR . A paper copy in the mail If you have any lab test that is abnormal or we need to change your treatment, we will call you to review the results.  Testing/Procedures: NONE  Follow-Up: At [facility], you and your health needs are our priority.  As part of our continuing mission to provide you with exceptional heart care, we have created designated Provider Care Teams.  These Care Teams include your primary Cardiologist (physician) and Advanced Practice Providers (APPs -  Physician Assistants and Nurse Practitioners) who all work together to provide you with the care you need, when you need it. You will need a follow up appointment in 6 months.  Please call our office 2 months in advance to schedule this appointment.  You may see Carlyle Dolly, MD or one of the following Advanced Practice Providers on your designated Care Team:   Bernerd Pho, PA-C [facility]) . Ermalinda Barrios, PA-C ([place])  Any Other Special Instructions Will Be Listed Below (If Applicable). NONE

## 2018-04-12 ENCOUNTER — Encounter (HOSPITAL_COMMUNITY): Payer: Self-pay | Admitting: *Deleted

## 2018-04-13 ENCOUNTER — Encounter (HOSPITAL_COMMUNITY)
Admission: RE | Disposition: A | Discharge status: Home / Self Care | Payer: Self-pay | Source: Ambulatory Visit | Attending: Internal Medicine

## 2018-04-13 ENCOUNTER — Ambulatory Visit (HOSPITAL_COMMUNITY): Payer: Medicare Other | Admitting: Anesthesiology

## 2018-04-13 ENCOUNTER — Ambulatory Visit (HOSPITAL_COMMUNITY)
Admission: RE | Admit: 2018-04-13 | Discharge: 2018-04-13 | Disposition: A | Discharge status: Home / Self Care | Payer: Medicare Other | Source: Ambulatory Visit | Attending: Internal Medicine | Admitting: Internal Medicine

## 2018-04-13 ENCOUNTER — Encounter (HOSPITAL_COMMUNITY): Payer: Self-pay

## 2018-04-13 DIAGNOSIS — Z87891 Personal history of nicotine dependence: Secondary | ICD-10-CM | POA: Insufficient documentation

## 2018-04-13 DIAGNOSIS — Z86010 Personal history of colonic polyps: Secondary | ICD-10-CM | POA: Insufficient documentation

## 2018-04-13 DIAGNOSIS — D649 Anemia, unspecified: Secondary | ICD-10-CM

## 2018-04-13 DIAGNOSIS — Z09 Encounter for follow-up examination after completed treatment for conditions other than malignant neoplasm: Secondary | ICD-10-CM | POA: Diagnosis present

## 2018-04-13 DIAGNOSIS — Z7982 Long term (current) use of aspirin: Secondary | ICD-10-CM | POA: Insufficient documentation

## 2018-04-13 DIAGNOSIS — I110 Hypertensive heart disease with heart failure: Secondary | ICD-10-CM | POA: Diagnosis not present

## 2018-04-13 DIAGNOSIS — Z79899 Other long term (current) drug therapy: Secondary | ICD-10-CM | POA: Insufficient documentation

## 2018-04-13 DIAGNOSIS — D123 Benign neoplasm of transverse colon: Secondary | ICD-10-CM | POA: Insufficient documentation

## 2018-04-13 DIAGNOSIS — I5042 Chronic combined systolic (congestive) and diastolic (congestive) heart failure: Secondary | ICD-10-CM | POA: Insufficient documentation

## 2018-04-13 DIAGNOSIS — E7800 Pure hypercholesterolemia, unspecified: Secondary | ICD-10-CM | POA: Insufficient documentation

## 2018-04-13 DIAGNOSIS — Z800 Family history of malignant neoplasm of digestive organs: Secondary | ICD-10-CM | POA: Diagnosis not present

## 2018-04-13 HISTORY — PX: POLYPECTOMY: SHX5525

## 2018-04-13 HISTORY — PX: COLONOSCOPY WITH PROPOFOL: SHX5780

## 2018-04-13 LAB — ETHANOL: Alcohol, Ethyl (B): <10 mg/dL (ref <10)

## 2018-04-13 SURGERY — COLONOSCOPY WITH PROPOFOL
Anesthesia: Monitor Anesthesia Care

## 2018-04-13 MED ORDER — PROPOFOL 10 MG/ML IV BOLUS
INTRAVENOUS | Status: AC
  Filled 2018-04-13: qty 40

## 2018-04-13 MED ORDER — LACTATED RINGERS IV SOLN
INTRAVENOUS | Status: DC | PRN
  Administered 2018-04-13: 10:00:00 via INTRAVENOUS

## 2018-04-13 MED ORDER — PROPOFOL 10 MG/ML IV BOLUS
INTRAVENOUS | Status: DC | PRN
  Administered 2018-04-13: 50 mg via INTRAVENOUS
  Administered 2018-04-13: 30 mg via INTRAVENOUS

## 2018-04-13 MED ORDER — CHLORHEXIDINE GLUCONATE CLOTH 2 % EX PADS: 6.0000 | MEDICATED_PAD | Freq: Once | CUTANEOUS | Status: DC

## 2018-04-13 MED ORDER — MIDAZOLAM HCL 2 MG/2ML IJ SOLN
INTRAMUSCULAR | Status: AC
  Filled 2018-04-13: qty 2

## 2018-04-13 MED ORDER — PROPOFOL 500 MG/50ML IV EMUL
INTRAVENOUS | Status: DC | PRN
  Administered 2018-04-13: 150 ug/kg/min via INTRAVENOUS

## 2018-04-13 MED ORDER — STERILE WATER FOR IRRIGATION IR SOLN
Status: DC | PRN
  Administered 2018-04-13: 100 mL

## 2018-04-13 NOTE — Addendum Note (Signed)
Addendum  created 04/13/18 1104 by Mickel Baas, CRNA   Charge Capture section accepted

## 2018-04-13 NOTE — Transfer of Care (Signed)
Immediate Anesthesia Transfer of Care Note  Patient: Gabriel Hammond  Procedure(s) Performed: COLONOSCOPY WITH PROPOFOL (N/A ) POLYPECTOMY  Patient Location: PACU  Anesthesia Type:MAC  Level of Consciousness: awake, alert , oriented and patient cooperative  Airway & Oxygen Therapy: Patient Spontanous Breathing  Post-op Assessment: Report given to RN and Post -op Vital signs reviewed and stable  Post vital signs: Reviewed and stable  Last Vitals:  Vitals Value Taken Time  BP 93/62 04/13/2018 10:45 AM  Temp    Pulse 64 04/13/2018 10:47 AM  Resp 19 04/13/2018 10:47 AM  SpO2 100 % 04/13/2018 10:47 AM  Vitals shown include unvalidated device data.  Last Pain:  Vitals:   04/13/18 1041  TempSrc:   PainSc: 0-No pain         Complications: No apparent anesthesia complications

## 2018-04-13 NOTE — Anesthesia Postprocedure Evaluation (Signed)
Anesthesia Post Note  Patient: Gabriel Hammond  Procedure(s) Performed: COLONOSCOPY WITH PROPOFOL (N/A ) POLYPECTOMY  Patient location during evaluation: PACU Anesthesia Type: MAC Level of consciousness: awake and alert and oriented Pain management: pain level controlled Vital Signs Assessment: post-procedure vital signs reviewed and stable Respiratory status: spontaneous breathing Cardiovascular status: stable Postop Assessment: no apparent nausea or vomiting Anesthetic complications: no     Last Vitals:  Vitals:   04/13/18 0903 04/13/18 1045  BP: 101/62   Pulse: 63   Temp: 36.7 C (P) 36.5 C  SpO2: 100%     Last Pain:  Vitals:   04/13/18 1041  TempSrc:   PainSc: 0-No pain                 Sacora Hawbaker A

## 2018-04-13 NOTE — Anesthesia Preprocedure Evaluation (Signed)
Anesthesia Evaluation  Patient identified by MRN, date of birth, ID band Patient awake    Reviewed: Allergy & Precautions, H&P , NPO status , Patient's Chart, lab work & pertinent test results, reviewed documented beta blocker date and time   Airway Mallampati: I  TM Distance: >3 FB Neck ROM: full    Dental  (+) Edentulous Upper, Edentulous Lower   Pulmonary neg pulmonary ROS, former smoker,    Pulmonary exam normal breath sounds clear to auscultation       Cardiovascular Exercise Tolerance: Good hypertension, +CHF   Rhythm:regular Rate:Normal     Neuro/Psych PSYCHIATRIC DISORDERS negative neurological ROS     GI/Hepatic negative GI ROS, Neg liver ROS,   Endo/Other  negative endocrine ROS  Renal/GU negative Renal ROS  negative genitourinary   Musculoskeletal   Abdominal   Peds  Hematology  (+) Blood dyscrasia, anemia ,   Anesthesia Other Findings   Reproductive/Obstetrics negative OB ROS                             Anesthesia Physical Anesthesia Plan  ASA: III  Anesthesia Plan: MAC   Post-op Pain Management:    Induction:   PONV Risk Score and Plan:   Airway Management Planned:   Additional Equipment:   Intra-op Plan:   Post-operative Plan:   Informed Consent: I have reviewed the patients History and Physical, chart, labs and discussed the procedure including the risks, benefits and alternatives for the proposed anesthesia with the patient or authorized representative who has indicated his/her understanding and acceptance.     Plan Discussed with: CRNA  Anesthesia Plan Comments:         Anesthesia Quick Evaluation
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
## 2018-04-13 NOTE — Discharge Instructions (Signed)
°Colonoscopy °Discharge Instructions ° °Read the instructions outlined below and refer to this sheet in the next few weeks. These discharge instructions provide you with general information on caring for yourself after you leave the hospital. Your doctor may also give you specific instructions. While your treatment has been planned according to the most current medical practices available, unavoidable complications occasionally occur. If you have any problems or questions after discharge, call Dr. Rourk at [phone]. °ACTIVITY °· You may resume your regular activity, but move at a slower pace for the next 24 hours.  °· Take frequent rest periods for the next 24 hours.  °· Walking will help get rid of the air and reduce the bloated feeling in your belly (abdomen).  °· No driving for 24 hours (because of the medicine (anesthesia) used during the test).   °· Do not sign any important legal documents or operate any machinery for 24 hours (because of the anesthesia used during the test).  °NUTRITION °· Drink plenty of fluids.  °· You may resume your normal diet as instructed by your doctor.  °· Begin with a light meal and progress to your normal diet. Heavy or fried foods are harder to digest and may make you feel sick to your stomach (nauseated).  °· Avoid alcoholic beverages for 24 hours or as instructed.  °MEDICATIONS °· You may resume your normal medications unless your doctor tells you otherwise.  °WHAT YOU CAN EXPECT TODAY °· Some feelings of bloating in the abdomen.  °· Passage of more gas than usual.  °· Spotting of blood in your stool or on the toilet paper.  °IF YOU HAD POLYPS REMOVED DURING THE COLONOSCOPY: °· No aspirin products for 7 days or as instructed.  °· No alcohol for 7 days or as instructed.  °· Eat a soft diet for the next 24 hours.  °FINDING OUT THE RESULTS OF YOUR TEST °Not all test results are available during your visit. If your test results are not back during the visit, make an appointment  with your caregiver to find out the results. Do not assume everything is normal if you have not heard from your caregiver or the medical facility. It is important for you to follow up on all of your test results.  °SEEK IMMEDIATE MEDICAL ATTENTION IF: °· You have more than a spotting of blood in your stool.  °· Your belly is swollen (abdominal distention).  °· You are nauseated or vomiting.  °· You have a temperature over 101.  °· You have abdominal pain or discomfort that is severe or gets worse throughout the day.  ° ° °Colon polyp information provided ° °Further recommendations to follow pending review of pathology report. ° ° °Colon Polyps °Polyps are tissue growths inside the body. Polyps can grow in many places, including the large intestine (colon). A polyp may be a round bump or a mushroom-shaped growth. You could have one polyp or several. °Most colon polyps are noncancerous (benign). However, some colon polyps can become cancerous over time. °What are the causes? °The exact cause of colon polyps is not known. °What increases the risk? °This condition is more likely to develop in people who: °· Have a family history of colon cancer or colon polyps. °· Are older than 50 or older than 45 if they are African American. °· Have inflammatory bowel disease, such as ulcerative colitis or Crohn disease. °· Are overweight. °· Smoke cigarettes. °· Do not get enough exercise. °· Drink too much alcohol. °·   Eat a diet that is: °? High in fat and red meat. °? Low in fiber. °· Had childhood cancer that was treated with abdominal radiation. ° °What are the signs or symptoms? °Most polyps do not cause symptoms. If you have symptoms, they may include: °· Blood coming from your rectum when having a bowel movement. °· Blood in your stool. The stool may look dark red or black. °· A change in bowel habits, such as constipation or diarrhea. ° °How is this diagnosed? °This condition is diagnosed with a colonoscopy. This is a  procedure that uses a lighted, flexible scope to look at the inside of your colon. °How is this treated? °Treatment for this condition involves removing any polyps that are found. Those polyps will then be tested for cancer. If cancer is found, your health care provider will talk to you about options for colon cancer treatment. °Follow these instructions at home: °Diet °· Eat plenty of fiber, such as fruits, vegetables, and whole grains. °· Eat foods that are high in calcium and vitamin D, such as milk, cheese, yogurt, eggs, liver, fish, and broccoli. °· Limit foods high in fat, red meats, and processed meats, such as hot dogs, sausage, bacon, and lunch meats. °· Maintain a healthy weight, or lose weight if recommended by your health care provider. °General instructions °· Do not smoke cigarettes. °· Do not drink alcohol excessively. °· Keep all follow-up visits as told by your health care provider. This is important. This includes keeping regularly scheduled colonoscopies. Talk to your health care provider about when you need a colonoscopy. °· Exercise every day or as told by your health care provider. °Contact a health care provider if: °· You have new or worsening bleeding during a bowel movement. °· You have new or increased blood in your stool. °· You have a change in bowel habits. °· You unexpectedly lose weight. °This information is not intended to replace advice given to you by your health care provider. Make sure you discuss any questions you have with your health care provider. °Document Released: 02/18/2004 Document Revised: 10/30/2015 Document Reviewed: 04/14/2015 °Elsevier Interactive Patient Education © 2018 [employer] ° °

## 2018-04-13 NOTE — Op Note (Signed)
[facility] Patient Name: Gabriel Hammond Procedure Date: 04/13/2018 10:01 AM MRN: [id number] Date of Birth: November 11, 1950 Attending MD: Norvel Richards , MD CSN: [id number] Age: 67 Admit Type: Outpatient Procedure:                Colonoscopy Indications:              High risk colon cancer surveillance: Personal                            history of colonic polyps Providers:                Norvel Richards, MD, Rosina Lowenstein, RN, Aram Candela Referring MD:              Medicines:                Propofol per Anesthesia Complications:            No immediate complications. Estimated Blood Loss:      Procedure:                Pre-Anesthesia Assessment:                           - Prior to the procedure, a History and Physical                            was performed, and patient medications and                            allergies were reviewed. [name] tolerance of                            previous anesthesia was also reviewed. The risks                            and benefits of the procedure and the sedation                            options and risks were discussed with the patient.                            All questions were answered, and informed consent                            was obtained. Prior Anticoagulants: The patient has                            taken no previous anticoagulant or antiplatelet                            agents. ASA Grade Assessment: III - A patient with                            severe systemic disease.  After reviewing the risks                            and benefits, the patient was deemed in                            satisfactory condition to undergo the procedure.                           After obtaining informed consent, the colonoscope                            was passed under direct vision. Throughout the                            procedure, the patient's blood pressure, pulse, and                     oxygen saturations were monitored continuously. The                            [id number]-[id number] ([id number]) scope was introduced through                            the and advanced to the the cecum, identified by                            appendiceal orifice and ileocecal valve. The                            colonoscopy was performed without difficulty. The                            patient tolerated the procedure well. The quality                            of the bowel preparation was adequate. Scope In: 10:27:41 AM Scope Out: 10:40:31 AM Scope Withdrawal Time: 0 hours 9 minutes 46 seconds  Total Procedure Duration: 0 hours 12 minutes 50 seconds  Findings:      The perianal and digital rectal examinations were normal.      Three semi-pedunculated polyps were found in the transverse colon. The       polyps were 4 to 6 mm in size. These polyps were removed with a cold       snare. Resection and retrieval were complete. Estimated blood loss was       minimal.      The exam was otherwise without abnormality on direct and retroflexion       views. Impression:               - Three 4 to 6 mm polyps in the transverse colon,                            removed with a cold snare. Resected and retrieved.                           -  The examination was otherwise normal on direct                            and retroflexion views. Moderate Sedation:      Moderate (conscious) sedation was personally administered by an       anesthesia professional. The following parameters were monitored: oxygen       saturation, heart rate, blood pressure, respiratory rate, EKG, adequacy       of pulmonary ventilation, and response to care. Recommendation:           - Patient has a contact number available for                            emergencies. The signs and symptoms of potential                            delayed complications were discussed with the                            patient. Return to  normal activities tomorrow.                            Written discharge instructions were provided to the                            patient.                           - Resume previous diet.                           - Continue present medications.                           - Repeat colonoscopy date to be determined after                            pending pathology results are reviewed for                            surveillance based on pathology results.                           - Return to GI office (date not yet determined). Procedure Code(s):        --- Professional ---                           [phone], Colonoscopy, flexible; with removal of                            tumor(s), polyp(s), or other lesion(s) by snare                            technique Diagnosis Code(s):        --- Professional ---  Z86.010, Personal history of colonic polyps                           D12.3, Benign neoplasm of transverse colon (hepatic                            flexure or splenic flexure) CPT copyright 2018 American Medical Association. All rights reserved. The codes documented in this report are preliminary and upon coder review may  be revised to meet current compliance requirements. Cristopher Estimable. Veanna Dower, MD Norvel Richards, MD 04/13/2018 10:45:38 AM This report has been signed electronically. Number of Addenda: 0

## 2018-04-16 ENCOUNTER — Encounter: Payer: Self-pay | Admitting: Internal Medicine

## 2018-04-18 NOTE — Addendum Note (Signed)
Addendum  created 04/18/18 1115 by Vista Deck, CRNA   Intraprocedure Staff edited

## 2018-04-19 ENCOUNTER — Encounter (HOSPITAL_COMMUNITY): Payer: Self-pay | Admitting: Internal Medicine

## 2018-05-02 ENCOUNTER — Other Ambulatory Visit: Payer: Self-pay | Admitting: Cardiology

## 2018-07-04 ENCOUNTER — Other Ambulatory Visit: Payer: Self-pay | Admitting: Cardiology

## 2018-07-10 ENCOUNTER — Telehealth: Payer: Self-pay | Admitting: Student

## 2018-07-10 NOTE — Telephone Encounter (Signed)
[employer] calling in regards to prior authorization. The Letter reference # [id number]. Please return call to above  Number./ tg

## 2018-07-11 NOTE — Telephone Encounter (Signed)
I spoke with misty at [facility] on [address].She said there is no problem with his rx and it was just filled

## 2018-07-24 DIAGNOSIS — Z6823 Body mass index (BMI) 23.0-23.9, adult: Secondary | ICD-10-CM | POA: Diagnosis not present

## 2018-07-24 DIAGNOSIS — Z0001 Encounter for general adult medical examination with abnormal findings: Secondary | ICD-10-CM | POA: Diagnosis not present

## 2018-08-17 ENCOUNTER — Other Ambulatory Visit: Payer: Self-pay | Admitting: Student

## 2018-08-28 ENCOUNTER — Other Ambulatory Visit: Payer: Self-pay | Admitting: Cardiology

## 2018-09-04 ENCOUNTER — Encounter: Payer: Self-pay | Admitting: Gastroenterology

## 2018-09-04 ENCOUNTER — Other Ambulatory Visit: Payer: Self-pay

## 2018-09-04 ENCOUNTER — Ambulatory Visit (INDEPENDENT_AMBULATORY_CARE_PROVIDER_SITE_OTHER): Payer: Medicare Other | Admitting: Gastroenterology

## 2018-09-04 DIAGNOSIS — D649 Anemia, unspecified: Secondary | ICD-10-CM

## 2018-09-04 DIAGNOSIS — K760 Fatty (change of) liver, not elsewhere classified: Secondary | ICD-10-CM

## 2018-09-04 DIAGNOSIS — D509 Iron deficiency anemia, unspecified: Secondary | ICD-10-CM

## 2018-09-04 DIAGNOSIS — Z86010 Personal history of colonic polyps: Secondary | ICD-10-CM

## 2018-09-04 DIAGNOSIS — F1010 Alcohol abuse, uncomplicated: Secondary | ICD-10-CM
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
## 2018-09-04 NOTE — Patient Instructions (Signed)
1. We plan on labs and ultrasound of your liver in June 2020.  We will be in touch.   2. Please let us know if you have any change in your health status such as abdominal pain, black stools, weakness, vomiting. 3. Please continue to try to cut back on alcohol consumption as this can be very damaging to your liver and your heart but also increase your risk of other medical conditions. 4. Your next colonoscopy will be due in November 2024. 5. Return to the office in July 2020.

## 2018-09-04 NOTE — Progress Notes (Signed)
cc'ed to pcp °

## 2018-09-05 NOTE — Progress Notes (Signed)
cc'ed to pcp °

## 2018-09-21 ENCOUNTER — Other Ambulatory Visit: Payer: Self-pay | Admitting: Student

## 2018-10-13 ENCOUNTER — Telehealth: Payer: Self-pay

## 2018-10-13 NOTE — Telephone Encounter (Signed)
Pt agrees to phone visit. Is not able to obtain vitals, weight. He does know medications.      Virtual Visit Pre-Appointment Phone Call  "(Name), I am calling you today to discuss your upcoming appointment. We are currently trying to limit exposure to the virus that causes COVID-19 by seeing patients at home rather than in the office."  1. "What is the BEST phone number to call the day of the visit?" - include this in appointment notes  2. "Do you have or have access to (through a family member/friend) a smartphone with video capability that we can use for your visit?" a. If yes - list this number in appt notes as "cell" (if different from BEST phone #) and list the appointment type as a VIDEO visit in appointment notes b. If no - list the appointment type as a PHONE visit in appointment notes  3. Confirm consent - "In the setting of the current Covid19 crisis, you are scheduled for a (phone or video) visit with your provider on (date) at (time).  Just as we do with many in-office visits, in order for you to participate in this visit, we must obtain consent.  If you'd like, I can send this to your mychart (if signed up) or email for you to review.  Otherwise, I can obtain your verbal consent now.  All virtual visits are billed to your insurance company just like a normal visit would be.  By agreeing to a virtual visit, we'd like you to understand that the technology does not allow for your provider to perform an examination, and thus may limit your provider's ability to fully assess your condition. If your provider identifies any concerns that need to be evaluated in person, we will make arrangements to do so.  Finally, though the technology is pretty good, we cannot assure that it will always work on either your or our end, and in the setting of a video visit, we may have to convert it to a phone-only visit.  In either situation, we cannot ensure that we have a secure connection.  Are you willing to  proceed?" STAFF: Did the patient verbally acknowledge consent to telehealth visit? Document YES/NO here: YES    4. Advise patient to be prepared - "Two hours prior to your appointment, go ahead and check your blood pressure, pulse, oxygen saturation, and your weight (if you have the equipment to check those) and write them all down. When your visit starts, your provider will ask you for this information. If you have an Apple Watch or Kardia device, please plan to have heart rate information ready on the day of your appointment. Please have a pen and paper handy nearby the day of the visit as well."  5. Give patient instructions for MyChart download to smartphone OR Doximity/Doxy.me as below if video visit (depending on what platform provider is using)  6. Inform patient they will receive a phone call 15 minutes prior to their appointment time (may be from unknown caller ID) so they should be prepared to answer    TELEPHONE CALL NOTE  Gabriel Hammond has been deemed a candidate for a follow-up tele-health visit to limit community exposure during the Covid-19 pandemic. I spoke with the patient via phone to ensure availability of phone/video source, confirm preferred email & phone number, and discuss instructions and expectations.  I reminded Gabriel Hammond to be prepared with any vital sign and/or heart rhythm information that could potentially be obtained  via home monitoring, at the time of his visit. I reminded Gabriel Hammond to expect a phone call prior to his visit.  Drema Dallas, Economy 10/13/2018 11:34 AM   INSTRUCTIONS FOR DOWNLOADING THE MYCHART APP TO SMARTPHONE  - The patient must first make sure to have activated MyChart and know their login information - If Apple, go to [employer] and type in MyChart in the search bar and download the app. If Android, ask patient to go to [employer] and type in [place] in the search bar and download the app. The app is free but as with any other  app downloads, their phone may require them to verify saved payment information or Apple/Android password.  - The patient will need to then log into the app with their MyChart username and password, and select [facility] as their healthcare provider to link the account. When it is time for your visit, go to the MyChart app, find appointments, and click Begin Video Visit. Be sure to Select Allow for your device to access the Microphone and Camera for your visit. You will then be connected, and your provider will be with you shortly.  **If they have any issues connecting, or need assistance please contact MyChart service desk [phone])**  **If using a computer, in order to ensure the best quality for their visit they will need to use either of the following Internet Browsers: [employer], or Google Chrome**  IF USING DOXIMITY or DOXY.ME - The patient will receive a link just prior to their visit by text.     FULL LENGTH CONSENT FOR TELE-HEALTH VISIT   I hereby voluntarily request, consent and authorize [facility] and its employed or contracted physicians, physician assistants, nurse practitioners or other licensed health care professionals (the Practitioner), to provide me with telemedicine health care services (the "Services") as deemed necessary by the treating Practitioner. I acknowledge and consent to receive the Services by the Practitioner via telemedicine. I understand that the telemedicine visit will involve communicating with the Practitioner through live audiovisual communication technology and the disclosure of certain medical information by electronic transmission. I acknowledge that I have been given the opportunity to request an in-person assessment or other available alternative prior to the telemedicine visit and am voluntarily participating in the telemedicine visit.  I understand that I have the right to withhold or withdraw my consent to the use of  telemedicine in the course of my care at any time, without affecting my right to future care or treatment, and that the Practitioner or I may terminate the telemedicine visit at any time. I understand that I have the right to inspect all information obtained and/or recorded in the course of the telemedicine visit and may receive copies of available information for a reasonable fee.  I understand that some of the potential risks of receiving the Services via telemedicine include:  Marland Kitchen Delay or interruption in medical evaluation due to technological equipment failure or disruption; . Information transmitted may not be sufficient (e.g. poor resolution of images) to allow for appropriate medical decision making by the Practitioner; and/or  . In rare instances, security protocols could fail, causing a breach of personal health information.  Furthermore, I acknowledge that it is my responsibility to provide information about my medical history, conditions and care that is complete and accurate to the best of my ability. I acknowledge that Practitioner's advice, recommendations, and/or decision may be based on factors not within their control, such  as incomplete or inaccurate data provided by me or distortions of diagnostic images or specimens that may result from electronic transmissions. I understand that the practice of medicine is not an exact science and that Practitioner makes no warranties or guarantees regarding treatment outcomes. I acknowledge that I will receive a copy of this consent concurrently upon execution via email to the email address I last provided but may also request a printed copy by calling the office of [facility].    I understand that my insurance will be billed for this visit.   I have read or had this consent read to me. . I understand the contents of this consent, which adequately explains the benefits and risks of the Services being provided via telemedicine.  . I have been  provided ample opportunity to ask questions regarding this consent and the Services and have had my questions answered to my satisfaction. . I give my informed consent for the services to be provided through the use of telemedicine in my medical care  By participating in this telemedicine visit I agree to the above.

## 2018-10-20 ENCOUNTER — Telehealth: Payer: Medicare Other | Admitting: Cardiology

## 2018-11-01 ENCOUNTER — Other Ambulatory Visit: Payer: Self-pay

## 2018-11-01 DIAGNOSIS — D509 Iron deficiency anemia, unspecified: Secondary | ICD-10-CM

## 2018-11-01 DIAGNOSIS — K760 Fatty (change of) liver, not elsewhere classified: Secondary | ICD-10-CM

## 2018-11-14 ENCOUNTER — Telehealth: Payer: Self-pay | Admitting: Internal Medicine

## 2018-11-14 NOTE — Telephone Encounter (Signed)
RECALL FOR ULTRASOUND 

## 2018-11-14 NOTE — Telephone Encounter (Signed)
Recall sent 

## 2018-11-22 ENCOUNTER — Other Ambulatory Visit: Payer: Self-pay

## 2018-11-22 ENCOUNTER — Telehealth (INDEPENDENT_AMBULATORY_CARE_PROVIDER_SITE_OTHER): Payer: Medicare Other | Admitting: Cardiology

## 2018-11-22 ENCOUNTER — Encounter: Payer: Self-pay | Admitting: Cardiology

## 2018-11-22 DIAGNOSIS — I10 Essential (primary) hypertension: Secondary | ICD-10-CM

## 2018-11-22 DIAGNOSIS — E782 Mixed hyperlipidemia: Secondary | ICD-10-CM

## 2018-11-22 DIAGNOSIS — I5022 Chronic systolic (congestive) heart failure: Secondary | ICD-10-CM | POA: Diagnosis not present
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
## 2018-11-22 NOTE — Patient Instructions (Signed)

## 2018-11-28 ENCOUNTER — Other Ambulatory Visit: Payer: Self-pay | Admitting: Cardiology

## 2018-12-05 ENCOUNTER — Telehealth: Payer: Self-pay | Admitting: Internal Medicine

## 2018-12-05 DIAGNOSIS — K760 Fatty (change of) liver, not elsewhere classified: Secondary | ICD-10-CM

## 2018-12-05 DIAGNOSIS — F1010 Alcohol abuse, uncomplicated: Secondary | ICD-10-CM

## 2018-12-05 NOTE — Telephone Encounter (Signed)
Pt's social worker Anderson Malta) called to say he received a letter to schedule his U/S. Please call Anderson Malta with date and time. [phone] ext [phone] and leave message if she doesn't answer.

## 2018-12-05 NOTE — Telephone Encounter (Signed)
Does patient need RUQ or complete u/s?

## 2018-12-10 NOTE — Telephone Encounter (Signed)
ruq us  

## 2018-12-11 NOTE — Addendum Note (Signed)
Addended by: Inge Rise on: 12/11/2018 08:05 AM   Modules accepted: Orders

## 2018-12-11 NOTE — Telephone Encounter (Signed)
RUQ scheduled for 7/9 at 9:30am, arrival time 9:15am, npo after midnight.   Called jennifer and detailed message on named VM with appt information

## 2018-12-14 ENCOUNTER — Ambulatory Visit (HOSPITAL_COMMUNITY)
Admission: RE | Admit: 2018-12-14 | Discharge: 2018-12-14 | Disposition: A | Discharge status: Home / Self Care | Payer: Medicare Other | Source: Ambulatory Visit | Attending: Gastroenterology | Admitting: Gastroenterology

## 2018-12-14 ENCOUNTER — Other Ambulatory Visit: Payer: Self-pay

## 2018-12-14 DIAGNOSIS — K760 Fatty (change of) liver, not elsewhere classified: Secondary | ICD-10-CM | POA: Insufficient documentation

## 2018-12-14 DIAGNOSIS — F1010 Alcohol abuse, uncomplicated: Secondary | ICD-10-CM | POA: Diagnosis not present

## 2018-12-25 ENCOUNTER — Encounter: Payer: Self-pay | Admitting: Internal Medicine

## 2019-01-29 ENCOUNTER — Other Ambulatory Visit: Payer: Self-pay | Admitting: Cardiology

## 2019-02-26 ENCOUNTER — Other Ambulatory Visit: Payer: Self-pay | Admitting: Cardiology

## 2019-03-27 ENCOUNTER — Other Ambulatory Visit: Payer: Self-pay

## 2019-03-27 ENCOUNTER — Encounter: Payer: Self-pay | Admitting: Gastroenterology

## 2019-03-27 ENCOUNTER — Ambulatory Visit (INDEPENDENT_AMBULATORY_CARE_PROVIDER_SITE_OTHER): Payer: Medicare Other | Admitting: Gastroenterology

## 2019-03-27 VITALS — BP 104/63 | HR 75 | Temp 98.5°F | Ht 67.0 in | Wt 158.6 lb

## 2019-03-27 DIAGNOSIS — K760 Fatty (change of) liver, not elsewhere classified: Secondary | ICD-10-CM

## 2019-03-27 DIAGNOSIS — F1010 Alcohol abuse, uncomplicated: Secondary | ICD-10-CM | POA: Diagnosis not present

## 2019-03-27 DIAGNOSIS — D649 Anemia, unspecified: Secondary | ICD-10-CM | POA: Diagnosis not present

## 2019-03-27 NOTE — Patient Instructions (Signed)
1. Please go far labs ([facility]) at your convenience. We will contact you with results as available. 2. Continue to cut back on your alcohol use. You have some damage from your alcohol intake over the years. If you do not cut way back, you may progress to cirrhosis of the liver.  3. We will see you back in six months.

## 2019-03-27 NOTE — Assessment & Plan Note (Signed)
Chronic normocytic anemia in the setting of alcohol abuse.  Due for updated labs.  Colonoscopy up-to-date.  No prior EGD.  Currently no evidence of overt cirrhosis.

## 2019-03-27 NOTE — Progress Notes (Signed)
CC'ED TO PCP 

## 2019-03-27 NOTE — Assessment & Plan Note (Addendum)
Update labs.  Continue to decrease alcohol use.  No overt cirrhosis on right upper quadrant ultrasound.  No evidence of thrombocytopenia.  Discussed risk of progression to cirrhosis.  Return to the office in 6 months.

## 2019-03-27 NOTE — Progress Notes (Signed)
Primary Care Physician: Curlene Labrum, MD  Primary Gastroenterologist:  Garfield Cornea, MD   Chief Complaint  Patient presents with  . Follow-up    FU U/S    HPI: Gabriel Hammond is a 68 y.o. male here for follow-up.  Last seen in March via telephone visit.  History of fatty liver and colon polyps.  Colonoscopy up-to-date, November 2019, couple of tubular adenomas removed.  Plans for 5-year follow-up colonoscopy.  He also has a history of alcohol abuse.  Elevated ferritin in the setting of chronic alcohol abuse.  Chronic normocytic anemia.  He was last seen he reported 24 ounces of beer, couple shots of liquor per day but not every day.  Drinking at least 4 to 5 days/week.  Reports this was cut back from prior years use.  Advised to continue cutting back due to at risk for cirrhosis.  He completed right upper quadrant ultrasound in July.  Evidence of hepatic steatosis.  Otherwise unremarkable.  He has not completed recommended labs to follow-up on liver and anemia.  Overall he feels well.  Denies any abdominal pain.  Appetite is good.  Bowel movements are normal.  No blood in the stool or melena.  States he continues to cut back on alcohol use.  He and a friend tends to pour out of the 40 ounce beer, shares most days.  He thinks he drinks about 24 ounces of beer at the most in a 24-hour period of time.  States this is much less than he used to in the past.       Current Outpatient Medications  Medication Sig Dispense Refill  . acetaminophen (TYLENOL) 500 MG tablet Take 500 mg by mouth as needed for mild pain or moderate pain.     . ASPIRIN LOW DOSE 81 MG EC tablet TAKE 1 TABLET BY MOUTH ONCE A DAY. 30 tablet 11  . atorvastatin (LIPITOR) 20 MG tablet TAKE ONE TABLET BY MOUTH DAILY. 30 tablet 6  . carvedilol (COREG) 25 MG tablet TAKE 1 TABLET BY MOUTH TWICE A DAY. 60 tablet 3  . diclofenac sodium (VOLTAREN) 1 % GEL Apply 2 g topically 4 (four) times daily.    Marland Kitchen ENTRESTO 97-103  MG TAKE 1 TABLET BY MOUTH TWICE A DAY. 60 tablet 11  . hydrochlorothiazide (MICROZIDE) 12.5 MG capsule TAKE 1 CAPSULE BY MOUTH DAILY. 30 capsule 3  . potassium chloride SA (K-DUR) 20 MEQ tablet TAKE 1 TABLET BY MOUTH ONCE A DAY. 30 tablet 6   No current facility-administered medications for this visit.     Allergies as of 03/27/2019  . (No Known Allergies)    ROS:  General: Negative for anorexia, weight loss, fever, chills, fatigue, weakness. ENT: Negative for hoarseness, difficulty swallowing , nasal congestion. CV: Negative for chest pain, angina, palpitations, dyspnea on exertion, peripheral edema.  Respiratory: Negative for dyspnea at rest, dyspnea on exertion, cough, sputum, wheezing.  GI: See history of present illness. GU:  Negative for dysuria, hematuria, urinary incontinence, urinary frequency, nocturnal urination.  Endo: Negative for unusual weight change.    Physical Examination:   BP 104/63   Pulse 75   Temp 98.5 F (36.9 C)   Ht 5\' 7"  (1.702 m)   Wt 158 lb 9.6 oz (71.9 kg)   BMI 24.84 kg/m   General: Well-nourished, well-developed in no acute distress.  Eyes: No icterus. Mouth: Oropharyngeal mucosa moist and pink , no lesions erythema or exudate. Lungs: Clear to auscultation  bilaterally.  Heart: Regular rate and rhythm, no murmurs rubs or gallops.  Abdomen: Bowel sounds are normal, nontender, nondistended, no hepatosplenomegaly or masses, no abdominal bruits or hernia , no rebound or guarding.   Extremities: No lower extremity edema. No clubbing or deformities. Neuro: Alert and oriented x 4   Skin: Warm and dry, no jaundice.   Psych: Alert and cooperative, normal mood and affect.      Imaging Studies: No results found.

## 2019-03-28 LAB — COMPREHENSIVE METABOLIC PANEL
AG Ratio: 1.7 (calc) (ref 1.0–2.5)
ALT: 13 U/L (ref 9–46)
AST: 18 U/L (ref 10–35)
Albumin: 4.7 g/dL (ref 3.6–5.1)
Alkaline phosphatase (APISO): 57 U/L (ref 35–144)
BUN: 15 mg/dL (ref 7–25)
CO2: 29 mmol/L (ref 20–32)
Calcium: 10.3 mg/dL (ref 8.6–10.3)
Chloride: 99 mmol/L (ref 98–110)
Creat: 0.78 mg/dL (ref 0.70–1.25)
Globulin: 2.8 g/dL (calc) (ref 1.9–3.7)
Glucose, Bld: 113 mg/dL (ref 65–139)
Potassium: 4.4 mmol/L (ref 3.5–5.3)
Sodium: 135 mmol/L (ref 135–146)
Total Bilirubin: 0.4 mg/dL (ref 0.2–1.2)
Total Protein: 7.5 g/dL (ref 6.1–8.1)

## 2019-03-28 LAB — PROTIME-INR
INR: 1
Prothrombin Time: 10.3 s (ref 9.0–11.5)

## 2019-03-28 LAB — CBC WITH DIFFERENTIAL/PLATELET
Absolute Monocytes: 529 cells/uL (ref 200–950)
Basophils Absolute: 41 cells/uL (ref 0–200)
Basophils Relative: 1 %
Eosinophils Absolute: 98 cells/uL (ref 15–500)
Eosinophils Relative: 2.4 %
HCT: 36.4 % — ABNORMAL LOW (ref 38.5–50.0)
Hemoglobin: 11.9 g/dL — ABNORMAL LOW (ref 13.2–17.1)
Lymphs Abs: 738 cells/uL — ABNORMAL LOW (ref 850–3900)
MCH: 28.2 pg (ref 27.0–33.0)
MCHC: 32.7 g/dL (ref 32.0–36.0)
MCV: 86.3 fL (ref 80.0–100.0)
MPV: 11 fL (ref 7.5–12.5)
Monocytes Relative: 12.9 %
Neutro Abs: 2694 cells/uL (ref 1500–7800)
Neutrophils Relative %: 65.7 %
Platelets: 268 10*3/uL (ref 140–400)
RBC: 4.22 10*6/uL (ref 4.20–5.80)
RDW: 14 % (ref 11.0–15.0)
Total Lymphocyte: 18 %
WBC: 4.1 10*3/uL (ref 3.8–10.8)

## 2019-03-28 LAB — IRON,TIBC AND FERRITIN PANEL
%SAT: 18 % (calc) — ABNORMAL LOW (ref 20–48)
Ferritin: 406 ng/mL — ABNORMAL HIGH (ref 24–380)
Iron: 66 ug/dL (ref 50–180)
TIBC: 358 mcg/dL (calc) (ref 250–425)

## 2019-03-29 ENCOUNTER — Other Ambulatory Visit: Payer: Self-pay | Admitting: Cardiology

## 2019-04-05 ENCOUNTER — Other Ambulatory Visit: Payer: Self-pay

## 2019-04-05 DIAGNOSIS — D649 Anemia, unspecified: Secondary | ICD-10-CM

## 2019-07-27 ENCOUNTER — Other Ambulatory Visit: Payer: Self-pay | Admitting: Cardiology

## 2019-08-09 ENCOUNTER — Other Ambulatory Visit: Payer: Self-pay | Admitting: *Deleted

## 2019-08-09 DIAGNOSIS — D649 Anemia, unspecified: Secondary | ICD-10-CM

## 2019-08-14 ENCOUNTER — Encounter: Payer: Self-pay | Admitting: *Deleted

## 2019-08-29 ENCOUNTER — Other Ambulatory Visit: Payer: Self-pay | Admitting: Cardiology

## 2019-09-25 ENCOUNTER — Ambulatory Visit (INDEPENDENT_AMBULATORY_CARE_PROVIDER_SITE_OTHER): Payer: Medicare Other | Admitting: Gastroenterology

## 2019-09-25 ENCOUNTER — Encounter: Payer: Self-pay | Admitting: Gastroenterology

## 2019-09-25 ENCOUNTER — Other Ambulatory Visit: Payer: Self-pay

## 2019-09-25 VITALS — BP 108/59 | HR 64 | Temp 96.2°F | Ht 68.0 in | Wt 160.2 lb

## 2019-09-25 DIAGNOSIS — D649 Anemia, unspecified: Secondary | ICD-10-CM | POA: Diagnosis not present

## 2019-09-25 DIAGNOSIS — F1010 Alcohol abuse, uncomplicated: Secondary | ICD-10-CM | POA: Diagnosis not present

## 2019-09-25 DIAGNOSIS — K760 Fatty (change of) liver, not elsewhere classified: Secondary | ICD-10-CM | POA: Diagnosis not present

## 2019-09-25 NOTE — Patient Instructions (Signed)
1. Please continue to cut back on alcohol. You are at increased risk of developing cirrhosis if you do not cut back. Try dropping back to 2 beers per day, then 1 beer per day, and goal of NONE over the next couple of months. If you want help to quit drinking, reach out to the 1-800-662-HELP (4357) and ask for help. They can provide you with your local resources.  2. Please go for labs today. Based on results, we will decide if you need and upper endoscopy.

## 2019-09-25 NOTE — Progress Notes (Signed)
Primary Care Physician: Curlene Labrum, MD  Primary Gastroenterologist:  Garfield Cornea, MD   Chief Complaint  Patient presents with  . fatty liver    HPI: Gabriel Hammond is a 69 y.o. male here for follow-up.  He was last seen in October 2020.  He has a history of fatty liver, colon polyps, alcohol abuse, chronic normocytic anemia.  Colonoscopy up-to-date, November 2019, couple of tubular adenomas removed.  Plans for next colonoscopy in 2024.  No prior upper endoscopy.  Right upper quadrant ultrasound in July 2020 showed evidence of hepatic steatosis but otherwise unremarkable.  Labs done in October, stable anemia with hemoglobin 11.9.  Ferritin remained elevated at 406, in the setting of chronic alcohol use.  Iron 66, iron saturation is 18%.  LFTs were normal.  Patient did not complete labs as requested for March 2021.  Continues to drink three 40 ounces beers daily. Drinks coffee. No abdominal pain. Appetite fair. Occasional heartburn with fried food. Tums helps. No dysphagia. BM regular. No melena, brbpr.  No weight loss.    Current Outpatient Medications  Medication Sig Dispense Refill  . acetaminophen (TYLENOL) 500 MG tablet Take 500 mg by mouth as needed for mild pain or moderate pain.     . ASPIRIN LOW DOSE 81 MG EC tablet TAKE 1 TABLET BY MOUTH ONCE A DAY. 30 tablet 11  . atorvastatin (LIPITOR) 20 MG tablet TAKE ONE TABLET BY MOUTH DAILY. 30 tablet 6  . carvedilol (COREG) 25 MG tablet TAKE 1 TABLET BY MOUTH TWICE A DAY. 60 tablet 6  . diclofenac sodium (VOLTAREN) 1 % GEL Apply 2 g topically 4 (four) times daily.    Marland Kitchen ENTRESTO 97-103 MG TAKE 1 TABLET BY MOUTH TWICE A DAY. 60 tablet 0  . hydrochlorothiazide (MICROZIDE) 12.5 MG capsule TAKE 1 CAPSULE BY MOUTH DAILY. 30 capsule 9  . potassium chloride SA (K-DUR) 20 MEQ tablet TAKE 1 TABLET BY MOUTH ONCE A DAY. 30 tablet 6   No current facility-administered medications for this visit.    Allergies as of 09/25/2019    . (No Known Allergies)    ROS:  General: Negative for anorexia, weight loss, fever, chills, fatigue, weakness. ENT: Negative for hoarseness, difficulty swallowing , nasal congestion. CV: Negative for chest pain, angina, palpitations, dyspnea on exertion, peripheral edema.  Respiratory: Negative for dyspnea at rest, dyspnea on exertion, cough, sputum, wheezing.  GI: See history of present illness. GU:  Negative for dysuria, hematuria, urinary incontinence, urinary frequency, nocturnal urination.  Endo: Negative for unusual weight change.    Physical Examination:   BP (!) 108/59   Pulse 64   Temp (!) 96.2 F (35.7 C) (Temporal)   Ht 5\' 8"  (1.727 m)   Wt 160 lb 3.2 oz (72.7 kg)   BMI 24.36 kg/m   General: Well-nourished, well-developed in no acute distress.  Ambulates with cane Eyes: No icterus. Mouth: masked Lungs: Clear to auscultation bilaterally.  Heart: Regular rate and rhythm, no murmurs rubs or gallops.  Abdomen: Bowel sounds are normal, nontender, nondistended, no hepatosplenomegaly or masses, no abdominal bruits or hernia , no rebound or guarding.   Extremities: No lower extremity edema. No clubbing or deformities. Neuro: Alert and oriented x 4   Skin: Warm and dry, no jaundice.   Psych: Alert and cooperative, normal mood and affect.  Labs:  Lab Results  Component Value Date   CREATININE 0.78 03/27/2019   BUN 15 03/27/2019   NA 135 03/27/2019  K 4.4 03/27/2019   CL 99 03/27/2019   CO2 29 03/27/2019   Lab Results  Component Value Date   ALT 13 03/27/2019   AST 18 03/27/2019   ALKPHOS 60 01/28/2018   BILITOT 0.4 03/27/2019   Lab Results  Component Value Date   WBC 4.1 03/27/2019   HGB 11.9 (L) 03/27/2019   HCT 36.4 (L) 03/27/2019   MCV 86.3 03/27/2019   PLT 268 03/27/2019   Lab Results  Component Value Date   FERRITIN 406 (H) 03/27/2019   Lab Results  Component Value Date   IRON 66 03/27/2019   TIBC 358 03/27/2019   FERRITIN 406 (H)  03/27/2019    Imaging Studies: No results found.

## 2019-09-25 NOTE — Assessment & Plan Note (Signed)
Ongoing alcohol abuse.  Continues to drink about 120 ounces of beer daily.  Discussed possibility of progression to cirrhosis with ongoing alcohol use.  Encouraged cutting back even further over the next couple of months.  National helpline information provided if he desires alcohol rehab or detox.  Discussed with the social worker who was present today.  We will update labs.  Return to the office in 6 months.

## 2019-09-25 NOTE — Assessment & Plan Note (Signed)
Chronic normocytic anemia in the setting of alcohol abuse.  No significant GI symptoms.  Update labs today.  Based on findings, may consider upper endoscopy.  Colonoscopy currently up-to-date.

## 2019-09-26 ENCOUNTER — Other Ambulatory Visit: Payer: Self-pay | Admitting: Cardiology

## 2019-09-26 LAB — COMPREHENSIVE METABOLIC PANEL
ALT: 16 IU/L (ref 0–44)
AST: 24 IU/L (ref 0–40)
Albumin/Globulin Ratio: 1.8 (ref 1.2–2.2)
Albumin: 4.9 g/dL — ABNORMAL HIGH (ref 3.8–4.8)
Alkaline Phosphatase: 82 IU/L (ref 39–117)
BUN/Creatinine Ratio: 22 (ref 10–24)
BUN: 19 mg/dL (ref 8–27)
Bilirubin Total: 0.3 mg/dL (ref 0.0–1.2)
CO2: 22 mmol/L (ref 20–29)
Calcium: 11.7 mg/dL — ABNORMAL HIGH (ref 8.6–10.2)
Chloride: 98 mmol/L (ref 96–106)
Creatinine, Ser: 0.86 mg/dL (ref 0.76–1.27)
GFR calc Af Amer: 103 mL/min/{1.73_m2} (ref >59)
GFR calc non Af Amer: 89 mL/min/{1.73_m2} (ref >59)
Globulin, Total: 2.8 g/dL (ref 1.5–4.5)
Glucose: 114 mg/dL — ABNORMAL HIGH (ref 65–99)
Potassium: 3.8 mmol/L (ref 3.5–5.2)
Sodium: 137 mmol/L (ref 134–144)
Total Protein: 7.7 g/dL (ref 6.0–8.5)

## 2019-09-26 LAB — CBC WITH DIFFERENTIAL/PLATELET
Basophils Absolute: 0 10*3/uL (ref 0.0–0.2)
Basos: 1 %
EOS (ABSOLUTE): 0.2 10*3/uL (ref 0.0–0.4)
Eos: 4 %
Hematocrit: 35 % — ABNORMAL LOW (ref 37.5–51.0)
Hemoglobin: 11.6 g/dL — ABNORMAL LOW (ref 13.0–17.7)
Immature Grans (Abs): 0 10*3/uL (ref 0.0–0.1)
Immature Granulocytes: 1 %
Lymphocytes Absolute: 0.8 10*3/uL (ref 0.7–3.1)
Lymphs: 21 %
MCH: 28.8 pg (ref 26.6–33.0)
MCHC: 33.1 g/dL (ref 31.5–35.7)
MCV: 87 fL (ref 79–97)
Monocytes Absolute: 0.4 10*3/uL (ref 0.1–0.9)
Monocytes: 11 %
Neutrophils Absolute: 2.2 10*3/uL (ref 1.4–7.0)
Neutrophils: 62 %
Platelets: 233 10*3/uL (ref 150–450)
RBC: 4.03 x10E6/uL — ABNORMAL LOW (ref 4.14–5.80)
RDW: 14.3 % (ref 11.6–15.4)
WBC: 3.6 10*3/uL (ref 3.4–10.8)

## 2019-09-26 LAB — IRON,TIBC AND FERRITIN PANEL
Ferritin: 1002 ng/mL — ABNORMAL HIGH (ref 30–400)
Iron Saturation: 13 % — ABNORMAL LOW (ref 15–55)
Iron: 46 ug/dL (ref 38–169)
Total Iron Binding Capacity: 367 ug/dL (ref 250–450)
UIBC: 321 ug/dL (ref 111–343)

## 2019-09-26 LAB — PROTIME-INR
INR: 1 (ref 0.9–1.2)
Prothrombin Time: 10.7 s (ref 9.1–12.0)

## 2019-09-27 ENCOUNTER — Other Ambulatory Visit: Payer: Self-pay | Admitting: Gastroenterology

## 2019-09-27 DIAGNOSIS — R7989 Other specified abnormal findings of blood chemistry: Secondary | ICD-10-CM

## 2019-10-05 LAB — MULTIPLE MYELOMA PANEL, SERUM
Albumin SerPl Elph-Mcnc: 4.2 g/dL (ref 2.9–4.4)
Albumin/Glob SerPl: 1.3 (ref 0.7–1.7)
Alpha 1: 0.2 g/dL (ref 0.0–0.4)
Alpha2 Glob SerPl Elph-Mcnc: 1 g/dL (ref 0.4–1.0)
B-Globulin SerPl Elph-Mcnc: 1.1 g/dL (ref 0.7–1.3)
Gamma Glob SerPl Elph-Mcnc: 1 g/dL (ref 0.4–1.8)
Globulin, Total: 3.3 g/dL (ref 2.2–3.9)
IgA/Immunoglobulin A, Serum: 175 mg/dL (ref 61–437)
IgG (Immunoglobin G), Serum: 1216 mg/dL (ref 603–1613)
IgM (Immunoglobulin M), Srm: 179 mg/dL — ABNORMAL HIGH (ref 20–172)
Total Protein: 7.5 g/dL (ref 6.0–8.5)

## 2019-10-05 LAB — PTH, INTACT AND CALCIUM
Calcium: 10.3 mg/dL — ABNORMAL HIGH (ref 8.6–10.2)
PTH: 16 pg/mL (ref 15–65)

## 2019-10-05 LAB — HEMOCHROMATOSIS DNA-PCR(C282Y,H63D)

## 2019-10-08 ENCOUNTER — Other Ambulatory Visit: Payer: Self-pay

## 2019-10-08 DIAGNOSIS — D649 Anemia, unspecified: Secondary | ICD-10-CM

## 2019-10-25 ENCOUNTER — Other Ambulatory Visit: Payer: Self-pay | Admitting: Cardiology

## 2019-11-06 ENCOUNTER — Encounter: Payer: Self-pay | Admitting: *Deleted

## 2019-11-06 ENCOUNTER — Other Ambulatory Visit: Payer: Self-pay | Admitting: *Deleted

## 2019-11-06 DIAGNOSIS — D649 Anemia, unspecified: Secondary | ICD-10-CM

## 2019-11-28 ENCOUNTER — Other Ambulatory Visit: Payer: Self-pay | Admitting: Cardiology

## 2019-12-03 ENCOUNTER — Other Ambulatory Visit: Payer: Self-pay

## 2019-12-03 ENCOUNTER — Other Ambulatory Visit (HOSPITAL_COMMUNITY)
Admission: RE | Admit: 2019-12-03 | Discharge: 2019-12-03 | Disposition: A | Discharge status: Home / Self Care | Payer: Medicare Other | Source: Ambulatory Visit | Attending: Gastroenterology | Admitting: Gastroenterology

## 2019-12-03 DIAGNOSIS — D649 Anemia, unspecified: Secondary | ICD-10-CM | POA: Diagnosis present

## 2019-12-03 LAB — CBC WITH DIFFERENTIAL/PLATELET
Abs Immature Granulocytes: 0.01 10*3/uL (ref 0.00–0.07)
Basophils Absolute: 0 10*3/uL (ref 0.0–0.1)
Basophils Relative: 1 %
Eosinophils Absolute: 0.1 10*3/uL (ref 0.0–0.5)
Eosinophils Relative: 3 %
HCT: 34.6 % — ABNORMAL LOW (ref 39.0–52.0)
Hemoglobin: 11 g/dL — ABNORMAL LOW (ref 13.0–17.0)
Immature Granulocytes: 0 %
Lymphocytes Relative: 19 %
Lymphs Abs: 0.8 10*3/uL (ref 0.7–4.0)
MCH: 28.5 pg (ref 26.0–34.0)
MCHC: 31.8 g/dL (ref 30.0–36.0)
MCV: 89.6 fL (ref 80.0–100.0)
Monocytes Absolute: 0.5 10*3/uL (ref 0.1–1.0)
Monocytes Relative: 11 %
Neutro Abs: 3 10*3/uL (ref 1.7–7.7)
Neutrophils Relative %: 66 %
Platelets: 268 10*3/uL (ref 150–400)
RBC: 3.86 MIL/uL — ABNORMAL LOW (ref 4.22–5.81)
RDW: 15 % (ref 11.5–15.5)
WBC: 4.5 10*3/uL (ref 4.0–10.5)
nRBC: 0 % (ref 0.0–0.2)

## 2019-12-03 LAB — FERRITIN: Ferritin: 691 ng/mL — ABNORMAL HIGH (ref 24–336)

## 2019-12-06 ENCOUNTER — Telehealth (INDEPENDENT_AMBULATORY_CARE_PROVIDER_SITE_OTHER): Payer: Medicare Other | Admitting: Cardiology

## 2019-12-06 ENCOUNTER — Other Ambulatory Visit: Payer: Self-pay

## 2019-12-06 NOTE — Progress Notes (Signed)
Not able to reach patient    Gabriel Abts MD

## 2019-12-21 ENCOUNTER — Other Ambulatory Visit: Payer: Self-pay | Admitting: Cardiology

## 2019-12-31 ENCOUNTER — Telehealth: Payer: Self-pay | Admitting: Internal Medicine

## 2019-12-31 NOTE — Telephone Encounter (Signed)
He has chronic anemia which could be due to poor nutrition in setting of chronic etoh abuse. Some iron deficiency noted. colonoscopy has been completed already. EGD would be next step. Would recommend OV to discuss egd but if PATIENT refuses then would advise recheck labs in 8 weeks (CBC, folate, B12).

## 2019-12-31 NOTE — Telephone Encounter (Signed)
Anderson Malta (caseworker) called to say that patient received a letter from [place]. I told her it was where the nurse was trying to reach him with his lab results. She asked for the nurse to call her back. [phone] ext [phone]

## 2019-12-31 NOTE — Telephone Encounter (Signed)
Lmom for [employer]. Waiting on a return call.

## 2019-12-31 NOTE — Telephone Encounter (Signed)
Gabriel Hammond returned call and was given pts lab results from 12/10/19. Gabriel Hammond is aware that an ov was recommended to discuss possible EGD. Gabriel Hammond was inquiring about why an EGD would be needed. Per Gabriel Hammond, she was second guessing LSL, she just wanted to know was this alcohol related. She states that she has tried to help pt for years to d/c drinking.

## 2020-01-01 NOTE — Telephone Encounter (Signed)
Lmom, waiting on a return call.  

## 2020-01-01 NOTE — Telephone Encounter (Signed)
Gabriel Hammond returned call and is aware of LSL recommendations. Gabriel Hammond spoke with pt and he is in agreement with ov to discuss EGD. Pt was scheduled for 02/27/20 @ 9:30 AM.

## 2020-02-05 ENCOUNTER — Other Ambulatory Visit: Payer: Self-pay | Admitting: Cardiology

## 2020-02-27 ENCOUNTER — Ambulatory Visit (INDEPENDENT_AMBULATORY_CARE_PROVIDER_SITE_OTHER): Payer: Medicare Other | Admitting: Gastroenterology

## 2020-02-27 ENCOUNTER — Other Ambulatory Visit: Payer: Self-pay

## 2020-02-27 ENCOUNTER — Encounter: Payer: Self-pay | Admitting: Gastroenterology

## 2020-02-27 VITALS — BP 97/43 | HR 76 | Temp 97.1°F | Ht 68.0 in | Wt 158.0 lb

## 2020-02-27 DIAGNOSIS — K219 Gastro-esophageal reflux disease without esophagitis: Secondary | ICD-10-CM | POA: Diagnosis not present

## 2020-02-27 DIAGNOSIS — R1013 Epigastric pain: Secondary | ICD-10-CM | POA: Diagnosis not present

## 2020-02-27 DIAGNOSIS — F1010 Alcohol abuse, uncomplicated: Secondary | ICD-10-CM | POA: Diagnosis not present

## 2020-02-27 DIAGNOSIS — D649 Anemia, unspecified: Secondary | ICD-10-CM

## 2020-02-27 NOTE — Patient Instructions (Signed)
1. Please have labs done at [facility]. 2. Upper endoscopy as scheduled. See separate instructions.
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
## 2020-02-28 ENCOUNTER — Telehealth: Payer: Self-pay | Admitting: *Deleted

## 2020-02-28 LAB — CBC WITH DIFFERENTIAL/PLATELET
Basophils Absolute: 0 10*3/uL (ref 0.0–0.2)
Basos: 1 %
EOS (ABSOLUTE): 0.1 10*3/uL (ref 0.0–0.4)
Eos: 3 %
Hematocrit: 34.9 % — ABNORMAL LOW (ref 37.5–51.0)
Hemoglobin: 11.5 g/dL — ABNORMAL LOW (ref 13.0–17.7)
Immature Grans (Abs): 0 10*3/uL (ref 0.0–0.1)
Immature Granulocytes: 0 %
Lymphocytes Absolute: 0.8 10*3/uL (ref 0.7–3.1)
Lymphs: 18 %
MCH: 28.8 pg (ref 26.6–33.0)
MCHC: 33 g/dL (ref 31.5–35.7)
MCV: 88 fL (ref 79–97)
Monocytes Absolute: 0.5 10*3/uL (ref 0.1–0.9)
Monocytes: 11 %
Neutrophils Absolute: 2.8 10*3/uL (ref 1.4–7.0)
Neutrophils: 67 %
Platelets: 273 10*3/uL (ref 150–450)
RBC: 3.99 x10E6/uL — ABNORMAL LOW (ref 4.14–5.80)
RDW: 13.9 % (ref 11.6–15.4)
WBC: 4.1 10*3/uL (ref 3.4–10.8)

## 2020-02-28 LAB — B12 AND FOLATE PANEL
Folate: 7.7 ng/mL (ref >3.0)
Vitamin B-12: 502 pg/mL (ref 232–1245)

## 2020-02-28 NOTE — Telephone Encounter (Signed)
lmovm to schedule EGD with propofol. Dr. Gala Romney, asa 3

## 2020-03-03 ENCOUNTER — Other Ambulatory Visit: Payer: Self-pay | Admitting: Cardiology

## 2020-03-03 NOTE — Telephone Encounter (Signed)
Letter mailed

## 2020-03-26 ENCOUNTER — Other Ambulatory Visit: Payer: Self-pay | Admitting: Cardiology

## 2020-03-27 ENCOUNTER — Telehealth: Payer: Self-pay | Admitting: *Deleted

## 2020-03-27 NOTE — Telephone Encounter (Signed)
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 Gala Romney, asa 3

## 2020-03-31 NOTE — Telephone Encounter (Signed)
LMOVM for [employer]

## 2020-04-01 ENCOUNTER — Encounter: Payer: Self-pay | Admitting: *Deleted

## 2020-04-30 ENCOUNTER — Telehealth: Payer: Self-pay | Admitting: *Deleted

## 2020-04-30 NOTE — Telephone Encounter (Signed)
Called Jennifer to schedule for sooner appt. She was agreeable. Patient rescheduled to 12/30 at 12:45pm. Will fax updated information to her at [phone].

## 2020-04-30 NOTE — Telephone Encounter (Signed)
PA approved via [facility] website. Auth# [id number] DOS: Jun 05, 2020 - Jun 06, 2020

## 2020-05-05 ENCOUNTER — Telehealth: Payer: Self-pay | Admitting: Internal Medicine

## 2020-05-05 ENCOUNTER — Encounter: Payer: Self-pay | Admitting: *Deleted

## 2020-05-05 NOTE — Telephone Encounter (Signed)
Pt's caseworker, Anderson Malta from [place], called to reschedule patient's procedure on 06/05/2020 with Dr Gala Romney. [phone] ext [phone]

## 2020-05-05 NOTE — Telephone Encounter (Signed)
Spoke with Gabriel Hammond and I offered several dates in December but was not able to schedule for any of them. Patient has been scheduled for 06/24/20 at 9:30am. Aware will fax new instructions with new covid test appt to her.

## 2020-05-06 ENCOUNTER — Encounter: Payer: Self-pay | Admitting: *Deleted

## 2020-06-04 ENCOUNTER — Other Ambulatory Visit (HOSPITAL_COMMUNITY): Payer: Medicare Other

## 2020-06-08 ENCOUNTER — Other Ambulatory Visit: Payer: Self-pay | Admitting: Cardiology

## 2020-06-16 NOTE — Telephone Encounter (Signed)
DOS extended 06/05/2020-09/03/2020

## 2020-06-18 NOTE — Patient Instructions (Signed)
Gabriel Hammond  06/18/2020     @PREFPERIOPPHARMACY @   Your procedure is scheduled on  06/24/2020.  Report to [name] at  0800  A.M.  Call this number if you have problems the morning of surgery:  [phone]   Remember:  Follow the diet and prep instructions given to you by the office.                        Take these medicines the morning of surgery with A SIP OF WATER  Carvedilol, entresto.    Do not wear jewelry, make-up or nail polish.  Do not wear lotions, powders, or perfumes, or deodorant. Please brush your teeth.  Do not shave 48 hours prior to surgery.  Men may shave face and neck.  Do not bring valuables to the hospital.  [facility] is not responsible for any belongings or valuables.  Contacts, dentures or bridgework may not be worn into surgery.  Leave your suitcase in the car.  After surgery it may be brought to your room.  For patients admitted to the hospital, discharge time will be determined by your treatment team.  Patients discharged the day of surgery will not be allowed to drive home.   Name and phone number of your driver:   family Special instructions:  DO NOT smoke the morning of your procedure.  Please read over the following fact sheets that you were given. Anesthesia Post-op Instructions and Care and Recovery After Surgery       Upper Endoscopy, Adult, Care After This sheet gives you information about how to care for yourself after your procedure. Your health care provider may also give you more specific instructions. If you have problems or questions, contact your health care provider. What can I expect after the procedure? After the procedure, it is common to have:  A sore throat.  Mild stomach pain or discomfort.  Bloating.  Nausea. Follow these instructions at home:  Follow instructions from your health care provider about what to eat or drink after your procedure.  Return to your normal activities as told by your  health care provider. Ask your health care provider what activities are safe for you.  Take over-the-counter and prescription medicines only as told by your health care provider.  If you were given a sedative during the procedure, it can affect you for several hours. Do not drive or operate machinery until your health care provider says that it is safe.  Keep all follow-up visits as told by your health care provider. This is important.   Contact a health care provider if you have:  A sore throat that lasts longer than one day.  Trouble swallowing. Get help right away if:  You vomit blood or your vomit looks like coffee grounds.  You have: ? A fever. ? Bloody, black, or tarry stools. ? A severe sore throat or you cannot swallow. ? Difficulty breathing. ? Severe pain in your chest or abdomen. Summary  After the procedure, it is common to have a sore throat, mild stomach discomfort, bloating, and nausea.  If you were given a sedative during the procedure, it can affect you for several hours. Do not drive or operate machinery until your health care provider says that it is safe.  Follow instructions from your health care provider about what to eat or drink after your procedure.  Return to your normal activities as told by your  health care provider. This information is not intended to replace advice given to you by your health care provider. Make sure you discuss any questions you have with your health care provider. Document Revised: 05/22/2019 Document Reviewed: 10/24/2017 Elsevier Patient Education  2021 [place] After This sheet gives you information about how to care for yourself after your procedure. Your health care provider may also give you more specific instructions. If you have problems or questions, contact your health care provider. What can I expect after the procedure? After the procedure, it is common to  have:  Tiredness.  Forgetfulness about what happened after the procedure.  Impaired judgment for important decisions.  Nausea or vomiting.  Some difficulty with balance. Follow these instructions at home: For the time period you were told by your health care provider:  Rest as needed.  Do not participate in activities where you could fall or become injured.  Do not drive or use machinery.  Do not drink alcohol.  Do not take sleeping pills or medicines that cause drowsiness.  Do not make important decisions or sign legal documents.  Do not take care of children on your own.      Eating and drinking  Follow the diet that is recommended by your health care provider.  Drink enough fluid to keep your urine pale yellow.  If you vomit: ? Drink water, juice, or soup when you can drink without vomiting. ? Make sure you have little or no nausea before eating solid foods. General instructions  Have a responsible adult stay with you for the time you are told. It is important to have someone help care for you until you are awake and alert.  Take over-the-counter and prescription medicines only as told by your health care provider.  If you have sleep apnea, surgery and certain medicines can increase your risk for breathing problems. Follow instructions from your health care provider about wearing your sleep device: ? Anytime you are sleeping, including during daytime naps. ? While taking prescription pain medicines, sleeping medicines, or medicines that make you drowsy.  Avoid smoking.  Keep all follow-up visits as told by your health care provider. This is important. Contact a health care provider if:  You keep feeling nauseous or you keep vomiting.  You feel light-headed.  You are still sleepy or having trouble with balance after 24 hours.  You develop a rash.  You have a fever.  You have redness or swelling around the IV site. Get help right away if:  You have  trouble breathing.  You have new-onset confusion at home. Summary  For several hours after your procedure, you may feel tired. You may also be forgetful and have poor judgment.  Have a responsible adult stay with you for the time you are told. It is important to have someone help care for you until you are awake and alert.  Rest as told. Do not drive or operate machinery. Do not drink alcohol or take sleeping pills.  Get help right away if you have trouble breathing, or if you suddenly become confused. This information is not intended to replace advice given to you by your health care provider. Make sure you discuss any questions you have with your health care provider. Document Revised: 02/07/2020 Document Reviewed: 04/26/2019 Elsevier Patient Education  2021 [employer].

## 2020-06-20 ENCOUNTER — Other Ambulatory Visit (HOSPITAL_COMMUNITY)
Admission: RE | Admit: 2020-06-20 | Discharge: 2020-06-20 | Disposition: A | Discharge status: Home / Self Care | Payer: Medicare Other | Source: Ambulatory Visit | Attending: Internal Medicine | Admitting: Internal Medicine

## 2020-06-20 ENCOUNTER — Other Ambulatory Visit: Payer: Self-pay

## 2020-06-20 ENCOUNTER — Encounter (HOSPITAL_COMMUNITY)
Admission: RE | Admit: 2020-06-20 | Discharge: 2020-06-20 | Disposition: A | Discharge status: Home / Self Care | Payer: Medicare Other | Source: Ambulatory Visit | Attending: Internal Medicine | Admitting: Internal Medicine

## 2020-06-20 ENCOUNTER — Encounter (HOSPITAL_COMMUNITY): Payer: Self-pay | Admitting: Anesthesiology

## 2020-06-20 ENCOUNTER — Encounter (HOSPITAL_COMMUNITY): Payer: Self-pay

## 2020-06-20 DIAGNOSIS — Z20822 Contact with and (suspected) exposure to covid-19: Secondary | ICD-10-CM | POA: Insufficient documentation

## 2020-06-20 DIAGNOSIS — Z01818 Encounter for other preprocedural examination: Secondary | ICD-10-CM | POA: Diagnosis not present

## 2020-06-20 LAB — CBC WITH DIFFERENTIAL/PLATELET
Abs Immature Granulocytes: 0.01 10*3/uL (ref 0.00–0.07)
Basophils Absolute: 0 10*3/uL (ref 0.0–0.1)
Basophils Relative: 1 %
Eosinophils Absolute: 0.1 10*3/uL (ref 0.0–0.5)
Eosinophils Relative: 3 %
HCT: 35.5 % — ABNORMAL LOW (ref 39.0–52.0)
Hemoglobin: 11 g/dL — ABNORMAL LOW (ref 13.0–17.0)
Immature Granulocytes: 0 %
Lymphocytes Relative: 20 %
Lymphs Abs: 0.7 10*3/uL (ref 0.7–4.0)
MCH: 28.3 pg (ref 26.0–34.0)
MCHC: 31 g/dL (ref 30.0–36.0)
MCV: 91.3 fL (ref 80.0–100.0)
Monocytes Absolute: 0.5 10*3/uL (ref 0.1–1.0)
Monocytes Relative: 14 %
Neutro Abs: 2.3 10*3/uL (ref 1.7–7.7)
Neutrophils Relative %: 62 %
Platelets: 293 10*3/uL (ref 150–400)
RBC: 3.89 MIL/uL — ABNORMAL LOW (ref 4.22–5.81)
RDW: 14.9 % (ref 11.5–15.5)
WBC: 3.7 10*3/uL — ABNORMAL LOW (ref 4.0–10.5)
nRBC: 0 % (ref 0.0–0.2)

## 2020-06-20 LAB — COMPREHENSIVE METABOLIC PANEL
ALT: 17 U/L (ref 0–44)
AST: 18 U/L (ref 15–41)
Albumin: 4.3 g/dL (ref 3.5–5.0)
Alkaline Phosphatase: 52 U/L (ref 38–126)
Anion gap: 10 (ref 5–15)
BUN: 24 mg/dL — ABNORMAL HIGH (ref 8–23)
CO2: 24 mmol/L (ref 22–32)
Calcium: 10.2 mg/dL (ref 8.9–10.3)
Chloride: 102 mmol/L (ref 98–111)
Creatinine, Ser: 0.99 mg/dL (ref 0.61–1.24)
GFR, Estimated: >60 mL/min (ref >60)
Glucose, Bld: 112 mg/dL — ABNORMAL HIGH (ref 70–99)
Potassium: 3.6 mmol/L (ref 3.5–5.1)
Sodium: 136 mmol/L (ref 135–145)
Total Bilirubin: 0.5 mg/dL (ref 0.3–1.2)
Total Protein: 8 g/dL (ref 6.5–8.1)

## 2020-06-20 LAB — SARS CORONAVIRUS 2 (TAT 6-24 HRS): SARS Coronavirus 2: NEGATIVE

## 2020-06-27 ENCOUNTER — Encounter (HOSPITAL_COMMUNITY): Admission: RE | Payer: Self-pay | Source: Home / Self Care

## 2020-06-27 ENCOUNTER — Ambulatory Visit (HOSPITAL_COMMUNITY): Admission: RE | Admit: 2020-06-27 | Payer: Medicare Other | Source: Home / Self Care | Admitting: Internal Medicine

## 2020-06-27 SURGERY — ESOPHAGOGASTRODUODENOSCOPY (EGD) WITH PROPOFOL
Anesthesia: Monitor Anesthesia Care

## 2020-07-04 ENCOUNTER — Other Ambulatory Visit: Payer: Self-pay | Admitting: Cardiology

## 2020-07-10 ENCOUNTER — Other Ambulatory Visit (HOSPITAL_COMMUNITY): Payer: Medicare Other

## 2020-08-07 ENCOUNTER — Other Ambulatory Visit: Payer: Self-pay | Admitting: Cardiology

## 2020-08-18 ENCOUNTER — Encounter: Payer: Self-pay | Admitting: Gastroenterology

## 2020-08-18 ENCOUNTER — Other Ambulatory Visit: Payer: Self-pay

## 2020-08-18 ENCOUNTER — Telehealth: Payer: Self-pay

## 2020-08-18 ENCOUNTER — Ambulatory Visit (INDEPENDENT_AMBULATORY_CARE_PROVIDER_SITE_OTHER): Payer: Medicare Other | Admitting: Gastroenterology

## 2020-08-18 VITALS — BP 124/74 | HR 81 | Temp 97.1°F | Ht 68.0 in | Wt 161.6 lb

## 2020-08-18 DIAGNOSIS — R1013 Epigastric pain: Secondary | ICD-10-CM

## 2020-08-18 DIAGNOSIS — F1010 Alcohol abuse, uncomplicated: Secondary | ICD-10-CM

## 2020-08-18 DIAGNOSIS — D649 Anemia, unspecified: Secondary | ICD-10-CM

## 2020-08-18 NOTE — Telephone Encounter (Signed)
Pt was scheduled for EGD w/Dr. Gala Romney during OV this morning for 09/29/20. Social worker Network engineer Reiter) requested for pre-op/covid test and procedure instructions to also be faxed to her [phone]).  Pre-op/covid test and procedure instructions faxed to [facility] and mailed to pt.

## 2020-08-18 NOTE — Patient Instructions (Signed)
1. Upper endoscopy as scheduled.  Please see separate instructions. 2. Continue to cut back on alcohol use as it is bad for your health.  You are at risk of developing cirrhosis from chronic alcohol consumption.
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
## 2020-08-18 NOTE — Telephone Encounter (Signed)
PA for EGD submitted via [facility] website. PA# [id number], valid 09/29/20-12/28/20.

## 2020-08-20 NOTE — Progress Notes (Signed)
Cc'ed to pcp °

## 2020-09-22 NOTE — Patient Instructions (Signed)
Gabriel Hammond  09/22/2020     @PREFPERIOPPHARMACY @   Your procedure is scheduled on  09/29/2020.   Report to [facility] at  1100  A.M.  Call this number if you have problems the morning of surgery:  [phone]   Remember:  Follow the diet instructions given to you by the office.                     Take these medicines the morning of surgery with A SIP OF WATER  Carvedilol, entresto.     Please brush your teeth.  Do not wear jewelry, make-up or nail polish.  Do not wear lotions, powders, or perfumes, or deodorant.  Do not shave 48 hours prior to surgery.  Men may shave face and neck.  Do not bring valuables to the hospital.  [facility] is not responsible for any belongings or valuables.  Contacts, dentures or bridgework may not be worn into surgery.  Leave your suitcase in the car.  After surgery it may be brought to your room.  For patients admitted to the hospital, discharge time will be determined by your treatment team.  Patients discharged the day of surgery will not be allowed to drive home and must have someone with them for 24 hours.    Special instructions:  DO NOT smoke tobacco or vape for 24 hours before your procedure.  Please read over the following fact sheets that you were given. Anesthesia Post-op Instructions and Care and Recovery After Surgery       Upper Endoscopy, Adult, Care After This sheet gives you information about how to care for yourself after your procedure. Your health care provider may also give you more specific instructions. If you have problems or questions, contact your health care provider. What can I expect after the procedure? After the procedure, it is common to have:  A sore throat.  Mild stomach pain or discomfort.  Bloating.  Nausea. Follow these instructions at home:  Follow instructions from your health care provider about what to eat or drink after your procedure.  Return to your normal activities as  told by your health care provider. Ask your health care provider what activities are safe for you.  Take over-the-counter and prescription medicines only as told by your health care provider.  If you were given a sedative during the procedure, it can affect you for several hours. Do not drive or operate machinery until your health care provider says that it is safe.  Keep all follow-up visits as told by your health care provider. This is important.   Contact a health care provider if you have:  A sore throat that lasts longer than one day.  Trouble swallowing. Get help right away if:  You vomit blood or your vomit looks like coffee grounds.  You have: ? A fever. ? Bloody, black, or tarry stools. ? A severe sore throat or you cannot swallow. ? Difficulty breathing. ? Severe pain in your chest or abdomen. Summary  After the procedure, it is common to have a sore throat, mild stomach discomfort, bloating, and nausea.  If you were given a sedative during the procedure, it can affect you for several hours. Do not drive or operate machinery until your health care provider says that it is safe.  Follow instructions from your health care provider about what to eat or drink after your procedure.  Return to your normal activities as told  by your health care provider. This information is not intended to replace advice given to you by your health care provider. Make sure you discuss any questions you have with your health care provider. Document Revised: 05/22/2019 Document Reviewed: 10/24/2017 Elsevier Patient Education  2021 [employer] After This sheet gives you information about how to care for yourself after your procedure. Your health care provider may also give you more specific instructions. If you have problems or questions, contact your health care provider. What can I expect after the procedure? After the procedure, it is common to  have:  Tiredness.  Forgetfulness about what happened after the procedure.  Impaired judgment for important decisions.  Nausea or vomiting.  Some difficulty with balance. Follow these instructions at home: For the time period you were told by your health care provider:  Rest as needed.  Do not participate in activities where you could fall or become injured.  Do not drive or use machinery.  Do not drink alcohol.  Do not take sleeping pills or medicines that cause drowsiness.  Do not make important decisions or sign legal documents.  Do not take care of children on your own.      Eating and drinking  Follow the diet that is recommended by your health care provider.  Drink enough fluid to keep your urine pale yellow.  If you vomit: ? Drink water, juice, or soup when you can drink without vomiting. ? Make sure you have little or no nausea before eating solid foods. General instructions  Have a responsible adult stay with you for the time you are told. It is important to have someone help care for you until you are awake and alert.  Take over-the-counter and prescription medicines only as told by your health care provider.  If you have sleep apnea, surgery and certain medicines can increase your risk for breathing problems. Follow instructions from your health care provider about wearing your sleep device: ? Anytime you are sleeping, including during daytime naps. ? While taking prescription pain medicines, sleeping medicines, or medicines that make you drowsy.  Avoid smoking.  Keep all follow-up visits as told by your health care provider. This is important. Contact a health care provider if:  You keep feeling nauseous or you keep vomiting.  You feel light-headed.  You are still sleepy or having trouble with balance after 24 hours.  You develop a rash.  You have a fever.  You have redness or swelling around the IV site. Get help right away if:  You have  trouble breathing.  You have new-onset confusion at home. Summary  For several hours after your procedure, you may feel tired. You may also be forgetful and have poor judgment.  Have a responsible adult stay with you for the time you are told. It is important to have someone help care for you until you are awake and alert.  Rest as told. Do not drive or operate machinery. Do not drink alcohol or take sleeping pills.  Get help right away if you have trouble breathing, or if you suddenly become confused. This information is not intended to replace advice given to you by your health care provider. Make sure you discuss any questions you have with your health care provider. Document Revised: 02/07/2020 Document Reviewed: 04/26/2019 Elsevier Patient Education  2021 [employer].

## 2020-09-25 ENCOUNTER — Encounter (HOSPITAL_COMMUNITY): Payer: Self-pay

## 2020-09-25 ENCOUNTER — Encounter (HOSPITAL_COMMUNITY)
Admission: RE | Admit: 2020-09-25 | Discharge: 2020-09-25 | Disposition: A | Discharge status: Home / Self Care | Payer: 59 | Source: Ambulatory Visit | Attending: Internal Medicine | Admitting: Internal Medicine

## 2020-09-25 ENCOUNTER — Other Ambulatory Visit (HOSPITAL_COMMUNITY)
Admission: RE | Admit: 2020-09-25 | Discharge: 2020-09-25 | Disposition: A | Discharge status: Home / Self Care | Payer: 59 | Source: Ambulatory Visit | Attending: Internal Medicine | Admitting: Internal Medicine

## 2020-09-25 ENCOUNTER — Other Ambulatory Visit: Payer: Self-pay

## 2020-09-25 DIAGNOSIS — Z01812 Encounter for preprocedural laboratory examination: Secondary | ICD-10-CM | POA: Diagnosis not present

## 2020-09-25 DIAGNOSIS — Z20822 Contact with and (suspected) exposure to covid-19: Secondary | ICD-10-CM | POA: Diagnosis not present

## 2020-09-25 LAB — CBC WITH DIFFERENTIAL/PLATELET
Abs Immature Granulocytes: 0.01 10*3/uL (ref 0.00–0.07)
Basophils Absolute: 0 10*3/uL (ref 0.0–0.1)
Basophils Relative: 1 %
Eosinophils Absolute: 0.2 10*3/uL (ref 0.0–0.5)
Eosinophils Relative: 4 %
HCT: 35.1 % — ABNORMAL LOW (ref 39.0–52.0)
Hemoglobin: 11.2 g/dL — ABNORMAL LOW (ref 13.0–17.0)
Immature Granulocytes: 0 %
Lymphocytes Relative: 19 %
Lymphs Abs: 0.8 10*3/uL (ref 0.7–4.0)
MCH: 28.9 pg (ref 26.0–34.0)
MCHC: 31.9 g/dL (ref 30.0–36.0)
MCV: 90.5 fL (ref 80.0–100.0)
Monocytes Absolute: 0.4 10*3/uL (ref 0.1–1.0)
Monocytes Relative: 10 %
Neutro Abs: 2.7 10*3/uL (ref 1.7–7.7)
Neutrophils Relative %: 66 %
Platelets: 244 10*3/uL (ref 150–400)
RBC: 3.88 MIL/uL — ABNORMAL LOW (ref 4.22–5.81)
RDW: 15.1 % (ref 11.5–15.5)
WBC: 4.1 10*3/uL (ref 4.0–10.5)
nRBC: 0 % (ref 0.0–0.2)

## 2020-09-25 LAB — COMPREHENSIVE METABOLIC PANEL
ALT: 17 U/L (ref 0–44)
AST: 18 U/L (ref 15–41)
Albumin: 4.1 g/dL (ref 3.5–5.0)
Alkaline Phosphatase: 78 U/L (ref 38–126)
Anion gap: 10 (ref 5–15)
BUN: 17 mg/dL (ref 8–23)
CO2: 22 mmol/L (ref 22–32)
Calcium: 10 mg/dL (ref 8.9–10.3)
Chloride: 103 mmol/L (ref 98–111)
Creatinine, Ser: 0.78 mg/dL (ref 0.61–1.24)
GFR, Estimated: >60 mL/min (ref >60)
Glucose, Bld: 111 mg/dL — ABNORMAL HIGH (ref 70–99)
Potassium: 3.5 mmol/L (ref 3.5–5.1)
Sodium: 135 mmol/L (ref 135–145)
Total Bilirubin: 0.6 mg/dL (ref 0.3–1.2)
Total Protein: 7.4 g/dL (ref 6.5–8.1)

## 2020-09-25 LAB — SARS CORONAVIRUS 2 (TAT 6-24 HRS): SARS Coronavirus 2: NEGATIVE

## 2020-09-29 ENCOUNTER — Ambulatory Visit (HOSPITAL_COMMUNITY): Payer: 59 | Admitting: Anesthesiology

## 2020-09-29 ENCOUNTER — Encounter (HOSPITAL_COMMUNITY)
Admission: RE | Disposition: A | Discharge status: Home / Self Care | Payer: Self-pay | Source: Home / Self Care | Attending: Internal Medicine

## 2020-09-29 ENCOUNTER — Encounter (HOSPITAL_COMMUNITY): Payer: Self-pay | Admitting: Internal Medicine

## 2020-09-29 ENCOUNTER — Ambulatory Visit (HOSPITAL_COMMUNITY)
Admission: RE | Admit: 2020-09-29 | Discharge: 2020-09-29 | Disposition: A | Discharge status: Home / Self Care | Payer: 59 | Attending: Internal Medicine | Admitting: Internal Medicine

## 2020-09-29 DIAGNOSIS — Z79899 Other long term (current) drug therapy: Secondary | ICD-10-CM | POA: Insufficient documentation

## 2020-09-29 DIAGNOSIS — I110 Hypertensive heart disease with heart failure: Secondary | ICD-10-CM | POA: Diagnosis not present

## 2020-09-29 DIAGNOSIS — K449 Diaphragmatic hernia without obstruction or gangrene: Secondary | ICD-10-CM | POA: Diagnosis not present

## 2020-09-29 DIAGNOSIS — K760 Fatty (change of) liver, not elsewhere classified: Secondary | ICD-10-CM | POA: Diagnosis not present

## 2020-09-29 DIAGNOSIS — E7800 Pure hypercholesterolemia, unspecified: Secondary | ICD-10-CM | POA: Insufficient documentation

## 2020-09-29 DIAGNOSIS — F1010 Alcohol abuse, uncomplicated: Secondary | ICD-10-CM | POA: Diagnosis not present

## 2020-09-29 DIAGNOSIS — R1013 Epigastric pain: Secondary | ICD-10-CM | POA: Diagnosis present

## 2020-09-29 DIAGNOSIS — Z736 Limitation of activities due to disability: Secondary | ICD-10-CM | POA: Insufficient documentation

## 2020-09-29 DIAGNOSIS — Z7982 Long term (current) use of aspirin: Secondary | ICD-10-CM | POA: Insufficient documentation

## 2020-09-29 DIAGNOSIS — Z809 Family history of malignant neoplasm, unspecified: Secondary | ICD-10-CM | POA: Insufficient documentation

## 2020-09-29 DIAGNOSIS — K2100 Gastro-esophageal reflux disease with esophagitis, without bleeding: Secondary | ICD-10-CM | POA: Insufficient documentation

## 2020-09-29 DIAGNOSIS — Z87891 Personal history of nicotine dependence: Secondary | ICD-10-CM | POA: Diagnosis not present

## 2020-09-29 DIAGNOSIS — K319 Disease of stomach and duodenum, unspecified: Secondary | ICD-10-CM | POA: Diagnosis not present

## 2020-09-29 DIAGNOSIS — I5042 Chronic combined systolic (congestive) and diastolic (congestive) heart failure: Secondary | ICD-10-CM | POA: Diagnosis not present

## 2020-09-29 HISTORY — PX: BIOPSY: SHX5522

## 2020-09-29 HISTORY — PX: ESOPHAGOGASTRODUODENOSCOPY (EGD) WITH PROPOFOL: SHX5813

## 2020-09-29 SURGERY — ESOPHAGOGASTRODUODENOSCOPY (EGD) WITH PROPOFOL
Anesthesia: General

## 2020-09-29 MED ORDER — PHENYLEPHRINE 40 MCG/ML (10ML) SYRINGE FOR IV PUSH (FOR BLOOD PRESSURE SUPPORT)
PREFILLED_SYRINGE | INTRAVENOUS | Status: DC | PRN
  Administered 2020-09-29: 120 ug via INTRAVENOUS

## 2020-09-29 MED ORDER — LACTATED RINGERS IV SOLN
INTRAVENOUS | Status: DC
  Administered 2020-09-29: 1000 mL via INTRAVENOUS

## 2020-09-29 MED ORDER — PROPOFOL 500 MG/50ML IV EMUL
INTRAVENOUS | Status: DC | PRN
  Administered 2020-09-29: 150 ug/kg/min via INTRAVENOUS

## 2020-09-29 MED ORDER — LIDOCAINE HCL (CARDIAC) PF 100 MG/5ML IV SOSY
PREFILLED_SYRINGE | INTRAVENOUS | Status: DC | PRN
  Administered 2020-09-29: 50 mg via INTRAVENOUS

## 2020-09-29 MED ORDER — PROPOFOL 10 MG/ML IV BOLUS
INTRAVENOUS | Status: DC | PRN
  Administered 2020-09-29: 100 mg via INTRAVENOUS

## 2020-09-29 MED ORDER — GLYCOPYRROLATE 0.2 MG/ML IJ SOLN
0.2000 mg | Freq: Once | INTRAMUSCULAR | Status: AC
  Administered 2020-09-29: 0.2 mg via INTRAVENOUS

## 2020-09-29 MED ORDER — LIDOCAINE VISCOUS HCL 2 % MT SOLN
15.0000 mL | Freq: Once | OROMUCOSAL | Status: AC
  Administered 2020-09-29: 15 mL via OROMUCOSAL

## 2020-09-29 MED ORDER — GLYCOPYRROLATE 0.2 MG/ML IJ SOLN
INTRAMUSCULAR | Status: AC
  Filled 2020-09-29: qty 1

## 2020-09-29 MED ORDER — LIDOCAINE VISCOUS HCL 2 % MT SOLN
OROMUCOSAL | Status: AC
  Filled 2020-09-29: qty 15

## 2020-09-29 NOTE — Discharge Instructions (Signed)
EGD Discharge instructions Please read the instructions outlined below and refer to this sheet in the next few weeks. These discharge instructions provide you with general information on caring for yourself after you leave the hospital. Your doctor may also give you specific instructions. While your treatment has been planned according to the most current medical practices available, unavoidable complications occasionally occur. If you have any problems or questions after discharge, please call your doctor. ACTIVITY  You may resume your regular activity but move at a slower pace for the next 24 hours.   Take frequent rest periods for the next 24 hours.   Walking will help expel (get rid of) the air and reduce the bloated feeling in your abdomen.   No driving for 24 hours (because of the anesthesia (medicine) used during the test).   You may shower.   Do not sign any important legal documents or operate any machinery for 24 hours (because of the anesthesia used during the test).  NUTRITION  Drink plenty of fluids.   You may resume your normal diet.   Begin with a light meal and progress to your normal diet.   Avoid alcoholic beverages for 24 hours or as instructed by your caregiver.  MEDICATIONS  You may resume your normal medications unless your caregiver tells you otherwise.  WHAT YOU CAN EXPECT TODAY  You may experience abdominal discomfort such as a feeling of fullness or "gas" pains.  FOLLOW-UP  Your doctor will discuss the results of your test with you.  SEEK IMMEDIATE MEDICAL ATTENTION IF ANY OF THE FOLLOWING OCCUR:  Excessive nausea (feeling sick to your stomach) and/or vomiting.   Severe abdominal pain and distention (swelling).   Trouble swallowing.   Temperature over 101 F (37.8 C).   Rectal bleeding or vomiting of blood.    You have severe acid reflux injury to your esophagus  Your stomach was inflamed-biopsies taken  Stop drinking alcohol.  Begin  Protonix 40 mg pill twice daily-once before breakfast and supper-new prescription provided  Office visit with Neil Crouch in 6 weeks  Patient request, I called Foye Spurling [phone] -reviewed results.     Monitored Anesthesia Care, Care After This sheet gives you information about how to care for yourself after your procedure. Your health care provider may also give you more specific instructions. If you have problems or questions, contact your health care provider. What can I expect after the procedure? After the procedure, it is common to have:  Tiredness.  Forgetfulness about what happened after the procedure.  Impaired judgment for important decisions.  Nausea or vomiting.  Some difficulty with balance. Follow these instructions at home: For the time period you were told by your health care provider:  Rest as needed.  Do not participate in activities where you could fall or become injured.  Do not drive or use machinery.  Do not drink alcohol.  Do not take sleeping pills or medicines that cause drowsiness.  Do not make important decisions or sign legal documents.  Do not take care of children on your own.      Eating and drinking  Follow the diet that is recommended by your health care provider.  Drink enough fluid to keep your urine pale yellow.  If you vomit: ? Drink water, juice, or soup when you can drink without vomiting. ? Make sure you have little or no nausea before eating solid foods. General instructions  Have a responsible adult stay with you for the time  you are told. It is important to have someone help care for you until you are awake and alert.  Take over-the-counter and prescription medicines only as told by your health care provider.  If you have sleep apnea, surgery and certain medicines can increase your risk for breathing problems. Follow instructions from your health care provider about wearing your sleep device: ? Anytime you  are sleeping, including during daytime naps. ? While taking prescription pain medicines, sleeping medicines, or medicines that make you drowsy.  Avoid smoking.  Keep all follow-up visits as told by your health care provider. This is important. Contact a health care provider if:  You keep feeling nauseous or you keep vomiting.  You feel light-headed.  You are still sleepy or having trouble with balance after 24 hours.  You develop a rash.  You have a fever.  You have redness or swelling around the IV site. Get help right away if:  You have trouble breathing.  You have new-onset confusion at home. Summary  For several hours after your procedure, you may feel tired. You may also be forgetful and have poor judgment.  Have a responsible adult stay with you for the time you are told. It is important to have someone help care for you until you are awake and alert.  Rest as told. Do not drive or operate machinery. Do not drink alcohol or take sleeping pills.  Get help right away if you have trouble breathing, or if you suddenly become confused. This information is not intended to replace advice given to you by your health care provider. Make sure you discuss any questions you have with your health care provider. Document Revised: 02/07/2020 Document Reviewed: 04/26/2019 Elsevier Patient Education  2021 [place].     Gastroesophageal Reflux Disease, Adult  Gastroesophageal reflux (GER) happens when acid from the stomach flows up into the tube that connects the mouth and the stomach (esophagus). Normally, food travels down the esophagus and stays in the stomach to be digested. With GER, food and stomach acid sometimes move back up into the esophagus. You may have a disease called gastroesophageal reflux disease (GERD) if the reflux:  Happens often.  Causes frequent or very bad symptoms.  Causes problems such as damage to the esophagus. When this happens, the esophagus  becomes sore and swollen. Over time, GERD can make small holes (ulcers) in the lining of the esophagus. What are the causes? This condition is caused by a problem with the muscle between the esophagus and the stomach. When this muscle is weak or not normal, it does not close properly to keep food and acid from coming back up from the stomach. The muscle can be weak because of:  Tobacco use.  Pregnancy.  Having a certain type of hernia (hiatal hernia).  Alcohol use.  Certain foods and drinks, such as coffee, chocolate, onions, and peppermint. What increases the risk?  Being overweight.  Having a disease that affects your connective tissue.  Taking NSAIDs, such a ibuprofen. What are the signs or symptoms?  Heartburn.  Difficult or painful swallowing.  The feeling of having a lump in the throat.  A bitter taste in the mouth.  Bad breath.  Having a lot of saliva.  Having an upset or bloated stomach.  Burping.  Chest pain. Different conditions can cause chest pain. Make sure you see your doctor if you have chest pain.  Shortness of breath or wheezing.  A long-term cough or a cough at night.  Wearing away of the surface of teeth (tooth enamel).  Weight loss. How is this treated?  Making changes to your diet.  Taking medicine.  Having surgery. Treatment will depend on how bad your symptoms are. Follow these instructions at home: Eating and drinking  Follow a diet as told by your doctor. You may need to avoid foods and drinks such as: ? Coffee and tea, with or without caffeine. ? Drinks that contain alcohol. ? Energy drinks and sports drinks. ? Bubbly (carbonated) drinks or sodas. ? Chocolate and cocoa. ? Peppermint and mint flavorings. ? Garlic and onions. ? Horseradish. ? Spicy and acidic foods. These include peppers, chili powder, curry powder, vinegar, hot sauces, and BBQ sauce. ? Citrus fruit juices and citrus fruits, such as oranges, lemons, and  limes. ? Tomato-based foods. These include red sauce, chili, salsa, and pizza with red sauce. ? Fried and fatty foods. These include donuts, french fries, potato chips, and high-fat dressings. ? High-fat meats. These include hot dogs, rib eye steak, sausage, ham, and bacon. ? High-fat dairy items, such as whole milk, butter, and cream cheese.  Eat small meals often. Avoid eating large meals.  Avoid drinking large amounts of liquid with your meals.  Avoid eating meals during the 2-3 hours before bedtime.  Avoid lying down right after you eat.  Do not exercise right after you eat.   Lifestyle  Do not smoke or use any products that contain nicotine or tobacco. If you need help quitting, ask your doctor.  Try to lower your stress. If you need help doing this, ask your doctor.  If you are overweight, lose an amount of weight that is healthy for you. Ask your doctor about a safe weight loss goal.   General instructions  Pay attention to any changes in your symptoms.  Take over-the-counter and prescription medicines only as told by your doctor.  Do not take aspirin, ibuprofen, or other NSAIDs unless your doctor says it is okay.  Wear loose clothes. Do not wear anything tight around your waist.  Raise (elevate) the head of your bed about 6 inches (15 cm). You may need to use a wedge to do this.  Avoid bending over if this makes your symptoms worse.  Keep all follow-up visits. Contact a doctor if:  You have new symptoms.  You lose weight and you do not know why.  You have trouble swallowing or it hurts to swallow.  You have wheezing or a cough that keeps happening.  You have a hoarse voice.  Your symptoms do not get better with treatment. Get help right away if:  You have sudden pain in your arms, neck, jaw, teeth, or back.  You suddenly feel sweaty, dizzy, or light-headed.  You have chest pain or shortness of breath.  You vomit and the vomit is green, yellow, or  black, or it looks like blood or coffee grounds.  You faint.  Your poop (stool) is red, bloody, or black.  You cannot swallow, drink, or eat. These symptoms may represent a serious problem that is an emergency. Do not wait to see if the symptoms will go away. Get medical help right away. Call your local emergency services (911 in the [place] Do not drive yourself to the hospital. Summary  If a person has gastroesophageal reflux disease (GERD), food and stomach acid move back up into the esophagus and cause symptoms or problems such as damage to the esophagus.  Treatment will depend on how bad your  symptoms are.  Follow a diet as told by your doctor.  Take all medicines only as told by your doctor. This information is not intended to replace advice given to you by your health care provider. Make sure you discuss any questions you have with your health care provider. Document Revised: 12/03/2019 Document Reviewed: 12/03/2019 Elsevier Patient Education  [place].
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
## 2020-09-29 NOTE — Anesthesia Procedure Notes (Signed)
Date/Time: 09/29/2020 10:30 AM Performed by: Orlie Dakin, CRNA Pre-anesthesia Checklist: Patient identified, Emergency Drugs available, Suction available and Patient being monitored Patient Re-evaluated:Patient Re-evaluated prior to induction Oxygen Delivery Method: Nasal cannula Induction Type: IV induction Placement Confirmation: positive ETCO2

## 2020-09-29 NOTE — Transfer of Care (Signed)
Immediate Anesthesia Transfer of Care Note  Patient: Gabriel Hammond  Procedure(s) Performed: ESOPHAGOGASTRODUODENOSCOPY (EGD) WITH PROPOFOL (N/A ) BIOPSY  Patient Location: Short Stay  Anesthesia Type:General  Level of Consciousness: awake, alert  and oriented  Airway & Oxygen Therapy: Patient Spontanous Breathing  Post-op Assessment: Report given to RN and Post -op Vital signs reviewed and stable  Post vital signs: Reviewed and stable  Last Vitals:  Vitals Value Taken Time  BP    Temp    Pulse    Resp    SpO2      Last Pain:  Vitals:   09/29/20 0930  TempSrc: Oral  PainSc: 6       Patients Stated Pain Goal: 8 (40/97/35 3299)  Complications: No complications documented.

## 2020-09-29 NOTE — Anesthesia Preprocedure Evaluation (Signed)
Anesthesia Evaluation  Patient identified by MRN, date of birth, ID band Patient awake    Reviewed: Allergy & Precautions, NPO status , Patient's Chart, lab work & pertinent test results, reviewed documented beta blocker date and time   Airway Mallampati: II  TM Distance: >3 FB Neck ROM: Full    Dental  (+) Edentulous Upper, Edentulous Lower   Pulmonary former smoker,    Pulmonary exam normal breath sounds clear to auscultation       Cardiovascular Exercise Tolerance: Good hypertension, Pt. on medications and Pt. on home beta blockers +CHF   Rhythm:Irregular Rate:Normal  a. EF 10 to 15% by echo in 11/2014 with cath showing no significant CAD. b. EF improved to 50-55% by repeat imaging in 09/2017  20-Jun-2020 12:55:33 [facility] ROUTINE RECORD Sinus rhythm with occasional Premature ventricular complexes Possible Right ventricular hypertrophy T wave abnormality, consider inferior ischemia Abnormal ECG   Neuro/Psych PSYCHIATRIC DISORDERS    GI/Hepatic Neg liver ROS, GERD  ,  Endo/Other  negative endocrine ROS  Renal/GU negative Renal ROS     Musculoskeletal  (+) Arthritis ,   Abdominal   Peds  Hematology  (+) anemia ,   Anesthesia Other Findings   Reproductive/Obstetrics                            Anesthesia Physical Anesthesia Plan  ASA: III  Anesthesia Plan: General   Post-op Pain Management:    Induction: Intravenous  PONV Risk Score and Plan: Propofol infusion  Airway Management Planned: Nasal Cannula and Natural Airway  Additional Equipment:   Intra-op Plan:   Post-operative Plan:   Informed Consent: I have reviewed the patients History and Physical, chart, labs and discussed the procedure including the risks, benefits and alternatives for the proposed anesthesia with the patient or authorized representative who has indicated his/her understanding  and acceptance.     Dental advisory given  Plan Discussed with: CRNA and Surgeon  Anesthesia Plan Comments:         Anesthesia Quick Evaluation

## 2020-09-29 NOTE — Anesthesia Postprocedure Evaluation (Signed)
Anesthesia Post Note  Patient: Gabriel Hammond  Procedure(s) Performed: ESOPHAGOGASTRODUODENOSCOPY (EGD) WITH PROPOFOL (N/A ) BIOPSY  Patient location during evaluation: Phase II Anesthesia Type: General Level of consciousness: awake and alert and oriented Pain management: pain level controlled Vital Signs Assessment: post-procedure vital signs reviewed and stable Respiratory status: spontaneous breathing and respiratory function stable Cardiovascular status: blood pressure returned to baseline and stable Postop Assessment: no apparent nausea or vomiting Anesthetic complications: no   No complications documented.   Last Vitals:  Vitals:   09/29/20 0930 09/29/20 1038  BP: 130/84 103/69  Pulse: 70 83  Resp: 18 18  Temp: 37.1 C 37 C  SpO2: 96% 97%    Last Pain:  Vitals:   09/29/20 1038  TempSrc: Oral  PainSc: 0-No pain                 Deloras Reichard C Angelette Ganus

## 2020-09-29 NOTE — Op Note (Signed)
[name] Patient Name: Gabriel Hammond Procedure Date: 09/29/2020 10:21 AM MRN: [id number] Date of Birth: Mar 28, 1951 Attending MD: Norvel Richards , MD CSN: [id number] Age: 70 Admit Type: Outpatient Procedure:                Upper GI endoscopy Indications:              Epigastric abdominal pain Providers:                Norvel Richards, MD, Janeece Riggers, RN, Raphael Gibney, Technician Referring MD:              Medicines:                Propofol per Anesthesia Complications:            No immediate complications. Estimated Blood Loss:     Estimated blood loss was minimal. Procedure:                Pre-Anesthesia Assessment:                           - Prior to the procedure, a History and Physical                            was performed, and patient medications and                            allergies were reviewed. [name] tolerance of                            previous anesthesia was also reviewed. The risks                            and benefits of the procedure and the sedation                            options and risks were discussed with the patient.                            All questions were answered, and informed consent                            was obtained. Prior Anticoagulants: The patient has                            taken no previous anticoagulant or antiplatelet                            agents. ASA Grade Assessment: III - A patient with                            severe systemic disease. After reviewing the risks  and benefits, the patient was deemed in                            satisfactory condition to undergo the procedure.                           After obtaining informed consent, the endoscope was                            passed under direct vision. Throughout the                            procedure, the patient's blood pressure, pulse, and                             oxygen saturations were monitored continuously. The                            [id number]-[id number] ([id number]) scope was introduced through the                            mouth, and advanced to the second part of duodenum.                            The upper GI endoscopy was accomplished without                            difficulty. The patient tolerated the procedure                            well. Scope In: 10:29:09 AM Scope Out: 10:33:43 AM Total Procedure Duration: 0 hours 4 minutes 34 seconds  Findings:      Prominent geographic erosions and ulceration arising the GE junction and       into the distal esophagus approximately 5 to 6 cm consistent with severe       reflux related inflammation. I did not see Barrett's epithelium. Tubular       esophagus patent throughout its course.      A small hiatal hernia was present. Patchy erythema and gastric erosions       diffusely. No ulcer or infiltrating process seen. Patent pylorus.      The duodenal bulb and second portion of the duodenum were normal.       Biopsies of the abnormal gastric mucosa taken for histologic study. Impression:               - Severe erosive/ulcerative reflux esophagitis                            gastric erythema and erosions?"status post biopsy                           - Small hiatal hernia.                           - Normal duodenal bulb and second portion of the  duodenum.                           . No stigmata of chronic liver disease found on                            today's examination. Moderate Sedation:      Moderate (conscious) sedation was personally administered by an       anesthesia professional. The following parameters were monitored: oxygen       saturation, heart rate, blood pressure, respiratory rate, EKG, adequacy       of pulmonary ventilation, and response to care. Recommendation:           - Patient has a contact number available for                             emergencies. The signs and symptoms of potential                            delayed complications were discussed with the                            patient. Return to normal activities tomorrow.                            Written discharge instructions were provided to the                            patient.                           - Advance diet as tolerated. Begin Protonix 40 mg                            twice daily. Office visit with [place] in 6 weeks.                            Follow-up on pathology. Procedure Code(s):        --- Professional ---                           [phone], Esophagogastroduodenoscopy, flexible,                            transoral; diagnostic, including collection of                            specimen(s) by brushing or washing, when performed                            (separate procedure) Diagnosis Code(s):        --- Professional ---                           K44.9, Diaphragmatic hernia without obstruction or  gangrene                           R10.13, Epigastric pain CPT copyright 2019 [employer]. All rights reserved. The codes documented in this report are preliminary and upon coder review may  be revised to meet current compliance requirements. Cristopher Estimable. Jeoffrey Eleazer, MD Norvel Richards, MD 09/29/2020 10:47:49 AM This report has been signed electronically. Number of Addenda: 0

## 2020-09-30 ENCOUNTER — Encounter: Payer: Self-pay | Admitting: Internal Medicine

## 2020-09-30 LAB — SURGICAL PATHOLOGY

## 2020-10-02 ENCOUNTER — Encounter (HOSPITAL_COMMUNITY): Payer: Self-pay | Admitting: Internal Medicine

## 2020-12-09 ENCOUNTER — Other Ambulatory Visit: Payer: Self-pay | Admitting: Cardiology

## 2020-12-17 ENCOUNTER — Ambulatory Visit (INDEPENDENT_AMBULATORY_CARE_PROVIDER_SITE_OTHER): Payer: Medicare Other | Admitting: Gastroenterology

## 2020-12-17 ENCOUNTER — Other Ambulatory Visit: Payer: Self-pay

## 2020-12-17 ENCOUNTER — Encounter: Payer: Self-pay | Admitting: Gastroenterology

## 2020-12-17 VITALS — BP 107/59 | HR 74 | Temp 97.8°F | Ht 68.0 in | Wt 156.0 lb

## 2020-12-17 DIAGNOSIS — K2100 Gastro-esophageal reflux disease with esophagitis, without bleeding: Secondary | ICD-10-CM | POA: Diagnosis not present

## 2020-12-17 DIAGNOSIS — F1020 Alcohol dependence, uncomplicated: Secondary | ICD-10-CM

## 2020-12-17 DIAGNOSIS — K760 Fatty (change of) liver, not elsewhere classified: Secondary | ICD-10-CM | POA: Diagnosis not present

## 2020-12-17 NOTE — Patient Instructions (Signed)
Continue to cut back on drinking beer and liquor. You have fatty liver and are at risk to develop cirrhosis (scarring of the liver).  Continue pantoprazole 40mg  twice daily before a meal. When you return in six months, we will update your liver labs and consider decreasing your pantoprazole to once daily if you are doing well. Call if you have any questions or concerns.

## 2020-12-17 NOTE — Progress Notes (Signed)
Primary Care Physician: Curlene Labrum, MD  Primary Gastroenterologist:  Garfield Cornea, MD   Chief Complaint  Patient presents with   Follow-up    HPI: Gabriel Hammond is a 70 y.o. male here for follow-up.  Patient last seen in the office back in March.  History of chronic normocytic anemia, epigastric pain, alcohol abuse, colon polyps, fatty liver.  Patient has a history of elevated ferritin in the setting of heavy alcohol abuse, negative hemochromatosis genetic screen.  He feels well.  Denies any nausea or vomiting.  Rarely has breakthrough heartburn symptoms.  No dysphagia.  No abdominal pain.  Bowel movements regular.  No blood in the stool or melena.  No unintentional weight loss.  Continues to consume 36 ounces of beer daily and unspecified amount of liquor.  Patient states he has been trying to cut back.  Right upper quadrant ultrasound July 2020: -Hepatic steatosis  EGD April 2022: -Severe erosive/ulcerative reflux esophagitis gastric erythema and erosions?status post Biopsy (mild reactive gastropathy, negative for H. pylori) -Small hiatal hernia. -Normal duodenal bulb and second portion of the duodenum. -No stigmata of chronic liver disease found on today's examination.  Colonoscopy November 2019: -three 4 to 6 mm polyps removed, 2 were tubular adenomas. -Next colonoscopy in 5 years  Current Outpatient Medications  Medication Sig Dispense Refill   acetaminophen (TYLENOL) 500 MG tablet Take 500 mg by mouth as needed for mild pain or moderate pain.      ASPIRIN LOW DOSE 81 MG EC tablet TAKE 1 TABLET BY MOUTH ONCE A DAY. (Patient taking differently: Take 81 mg by mouth daily.) 30 tablet 1   atorvastatin (LIPITOR) 20 MG tablet TAKE ONE TABLET BY MOUTH DAILY. (Patient taking differently: Take 20 mg by mouth daily.) 90 tablet 1   carvedilol (COREG) 25 MG tablet Take 1 tablet (25 mg total) by mouth 2 (two) times daily with a meal. 180 tablet 1   diclofenac sodium  (VOLTAREN) 1 % GEL Apply 2 g topically 4 (four) times daily as needed (pain).     ENTRESTO 97-103 MG TAKE 1 TABLET BY MOUTH TWICE A DAY. 60 tablet 0   hydrochlorothiazide (MICROZIDE) 12.5 MG capsule TAKE 1 CAPSULE BY MOUTH DAILY. (Patient taking differently: Take 12.5 mg by mouth daily.) 90 capsule 1   pantoprazole (PROTONIX) 40 MG tablet Take 40 mg by mouth 2 (two) times daily.     potassium chloride SA (KLOR-CON) 20 MEQ tablet TAKE 1 TABLET BY MOUTH ONCE A DAY. (Patient taking differently: Take 20 mEq by mouth daily.) 90 tablet 1   No current facility-administered medications for this visit.    Allergies as of 12/17/2020   (No Known Allergies)    ROS:  General: Negative for anorexia, weight loss, fever, chills, fatigue, weakness. ENT: Negative for hoarseness, difficulty swallowing , nasal congestion. CV: Negative for chest pain, angina, palpitations, dyspnea on exertion, peripheral edema.  Respiratory: Negative for dyspnea at rest, dyspnea on exertion, cough, sputum, wheezing.  GI: See history of present illness. GU:  Negative for dysuria, hematuria, urinary incontinence, urinary frequency, nocturnal urination.  Endo: Negative for unusual weight change.    Physical Examination:   BP (!) 107/59   Pulse 74   Temp 97.8 F (36.6 C) (Temporal)   Ht 5\' 8"  (1.727 m)   Wt 156 lb (70.8 kg)   BMI 23.72 kg/m   General: Well-nourished, well-developed in no acute distress.  Accompanied by Education officer, museum. Eyes: No icterus. Mouth: masked.  Lungs: Clear to auscultation bilaterally.  Heart: Regular rate and rhythm, no murmurs rubs or gallops.  Abdomen: Bowel sounds are normal, nontender, nondistended, no hepatosplenomegaly or masses, no abdominal bruits or hernia , no rebound or guarding.   Extremities: No lower extremity edema. No clubbing or deformities. Neuro: Alert and oriented x 4   Skin: Warm and dry, no jaundice.   Psych: Alert and cooperative, normal mood and affect.  Labs:  Lab  Results  Component Value Date   WBC 4.1 09/25/2020   HGB 11.2 (L) 09/25/2020   HCT 35.1 (L) 09/25/2020   MCV 90.5 09/25/2020   PLT 244 09/25/2020   Lab Results  Component Value Date   CREATININE 0.78 09/25/2020   BUN 17 09/25/2020   NA 135 09/25/2020   K 3.5 09/25/2020   CL 103 09/25/2020   CO2 22 09/25/2020   Lab Results  Component Value Date   ALT 17 09/25/2020   AST 18 09/25/2020   ALKPHOS 78 09/25/2020   BILITOT 0.6 09/25/2020   Lab Results  Component Value Date   FOLATE 7.7 02/27/2020   Lab Results  Component Value Date   VITAMINB12 502 02/27/2020   Lab Results  Component Value Date   IRON 46 09/25/2019   TIBC 367 09/25/2019   FERRITIN 691 (H) 12/03/2019    Imaging Studies: No results found.   Assessment:  70 year old male with history of alcohol abuse, chronic anemia, GERD, fatty liver presenting for follow-up.  EGD earlier this year with severe reflux esophagitis, gastritis.  Epigastric pain has resolved.  Reflux well controlled.  Trying to cut back on alcohol use.  Currently on pantoprazole twice daily and feels well.  Chronic anemia, stable, labs up-to-date.  Suspect anemia due to chronic disease.  Patient has history of fatty liver on ultrasound in 2020, at risk for cirrhosis.  He has a brother who died from cirrhosis.  Encouraged him to continue to cut back on alcohol with goal of alcohol cessation   Plan: Continue to cut back on alcohol.  Goal of cessation. Continue pantoprazole 40 mg twice daily before meals for now. Return office visit in 6 months.  We will update labs and consider decreasing pantoprazole to once daily if he continues to do well. He will call with any questions or concerns in the interim.

## 2021-02-05 ENCOUNTER — Other Ambulatory Visit: Payer: Self-pay | Admitting: Internal Medicine

## 2021-02-05 ENCOUNTER — Other Ambulatory Visit: Payer: Self-pay | Admitting: Cardiology

## 2021-02-16 ENCOUNTER — Ambulatory Visit: Payer: Medicare Other | Admitting: Gastroenterology

## 2021-04-02 ENCOUNTER — Telehealth: Payer: Self-pay | Admitting: Cardiology

## 2021-04-02 NOTE — Telephone Encounter (Signed)
[place] called and wants [place] to fax the last name patient was seen in our office and last time he had a telephone call to [place]. Please fax to [place] at [phone]

## 2021-04-02 NOTE — Telephone Encounter (Signed)
Information faxed to [facility] as requested.

## 2021-04-07 ENCOUNTER — Other Ambulatory Visit: Payer: Self-pay | Admitting: Cardiology

## 2021-04-15 ENCOUNTER — Ambulatory Visit (INDEPENDENT_AMBULATORY_CARE_PROVIDER_SITE_OTHER): Payer: Medicare Other | Admitting: Cardiology

## 2021-04-15 ENCOUNTER — Other Ambulatory Visit: Payer: Self-pay

## 2021-04-15 ENCOUNTER — Encounter: Payer: Self-pay | Admitting: Cardiology

## 2021-04-15 VITALS — BP 110/64 | HR 80 | Ht 68.0 in | Wt 164.6 lb

## 2021-04-15 DIAGNOSIS — I5022 Chronic systolic (congestive) heart failure: Secondary | ICD-10-CM

## 2021-04-15 DIAGNOSIS — I2510 Atherosclerotic heart disease of native coronary artery without angina pectoris: Secondary | ICD-10-CM | POA: Diagnosis not present

## 2021-04-15 DIAGNOSIS — E782 Mixed hyperlipidemia: Secondary | ICD-10-CM | POA: Diagnosis not present

## 2021-04-15 DIAGNOSIS — I10 Essential (primary) hypertension: Secondary | ICD-10-CM | POA: Diagnosis not present

## 2021-04-15 NOTE — Patient Instructions (Signed)
Medication Instructions:  Your physician recommends that you continue on your current medications as directed. Please refer to the Current Medication list given to you today.  *If you need a refill on your cardiac medications before your next appointment, please call your pharmacy*   Lab Work: None If you have labs (blood work) drawn today and your tests are completely normal, you will receive your results only by: [place] (if you have MyChart) OR A paper copy in the mail If you have any lab test that is abnormal or we need to change your treatment, we will call you to review the results.   Testing/Procedures: None   Follow-Up: At [facility], you and your health needs are our priority.  As part of our continuing mission to provide you with exceptional heart care, we have created designated Provider Care Teams.  These Care Teams include your primary Cardiologist (physician) and Advanced Practice Providers (APPs -  Physician Assistants and Nurse Practitioners) who all work together to provide you with the care you need, when you need it.  We recommend signing up for the patient portal called "MyChart".  Sign up information is provided on this After Visit Summary.  MyChart is used to connect with patients for Virtual Visits (Telemedicine).  Patients are able to view lab/test results, encounter notes, upcoming appointments, etc.  Non-urgent messages can be sent to your provider as well.   To learn more about what you can do with MyChart, go to [email or web address].    Your next appointment:   1 year(s)  The format for your next appointment:   In Person  Provider:   Carlyle Dolly, MD    Other Instructions
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
## 2021-05-07 ENCOUNTER — Other Ambulatory Visit: Payer: Self-pay | Admitting: Cardiology

## 2021-06-09 ENCOUNTER — Other Ambulatory Visit: Payer: Self-pay | Admitting: Cardiology

## 2021-06-22 ENCOUNTER — Ambulatory Visit: Payer: Medicare Other | Admitting: Gastroenterology

## 2021-09-03 ENCOUNTER — Other Ambulatory Visit: Payer: Self-pay | Admitting: Cardiology

## 2021-09-17 ENCOUNTER — Encounter: Payer: Self-pay | Admitting: Internal Medicine

## 2021-09-17 ENCOUNTER — Other Ambulatory Visit: Payer: Self-pay | Admitting: Internal Medicine

## 2021-09-17 ENCOUNTER — Ambulatory Visit (INDEPENDENT_AMBULATORY_CARE_PROVIDER_SITE_OTHER): Payer: Medicare Other | Admitting: Internal Medicine

## 2021-09-17 VITALS — BP 100/48 | HR 92 | Ht 68.0 in | Wt 158.4 lb

## 2021-09-17 DIAGNOSIS — I5022 Chronic systolic (congestive) heart failure: Secondary | ICD-10-CM | POA: Diagnosis not present

## 2021-09-17 DIAGNOSIS — E782 Mixed hyperlipidemia: Secondary | ICD-10-CM | POA: Diagnosis not present

## 2021-09-17 DIAGNOSIS — R7303 Prediabetes: Secondary | ICD-10-CM | POA: Diagnosis not present

## 2021-09-17 DIAGNOSIS — M170 Bilateral primary osteoarthritis of knee: Secondary | ICD-10-CM | POA: Diagnosis not present

## 2021-09-17 DIAGNOSIS — Z23 Encounter for immunization: Secondary | ICD-10-CM | POA: Diagnosis not present

## 2021-09-17 DIAGNOSIS — Z1159 Encounter for screening for other viral diseases: Secondary | ICD-10-CM

## 2021-09-17 DIAGNOSIS — E559 Vitamin D deficiency, unspecified: Secondary | ICD-10-CM

## 2021-09-17 DIAGNOSIS — K2100 Gastro-esophageal reflux disease with esophagitis, without bleeding: Secondary | ICD-10-CM

## 2021-09-17 DIAGNOSIS — F1021 Alcohol dependence, in remission: Secondary | ICD-10-CM

## 2021-09-17 MED ORDER — DICLOFENAC SODIUM 1 % EX GEL: 4.0000 g | Freq: Four times a day (QID) | CUTANEOUS | 2 refills | Status: DC

## 2021-09-17 NOTE — Patient Instructions (Signed)
Please continue to take medications as prescribed. ? ?Please continue to follow low salt diet and ambulate as tolerated. ? ?You were given PCV20 vaccine in the office today. ? ?Please consider getting Shingrix vaccine at your local pharmacy. ?

## 2021-09-17 NOTE — Assessment & Plan Note (Signed)
In remission ?His NICM could be due to alcohol abuse in the past ?

## 2021-09-17 NOTE — Assessment & Plan Note (Signed)
-   echo 11/2014 showed LVEF 10-15%, diffuse hypokinesis, restrictive diastolic fyunction - cath 01/2015 without significant CAD. RHC with CI 2, mean PA 31, PCWP 22.  - 09/2015 echo: LVEF 40%, grade I diasotlic dysfunction 09/2017 echo: LVEF 50-55%  On Entresto, Coreg and HCTZ BP low normal Appears euvolemic currently Followed by cardiology 

## 2021-09-17 NOTE — Telephone Encounter (Signed)
Last ov 12/17/20 ?

## 2021-09-17 NOTE — Assessment & Plan Note (Signed)
Tylenol as needed Voltaren gel as needed prescribed 
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
## 2021-09-17 NOTE — Assessment & Plan Note (Signed)
On statin Check lipid profile 

## 2021-09-17 NOTE — Assessment & Plan Note (Signed)
On pantoprazole currently ?

## 2021-09-18 ENCOUNTER — Other Ambulatory Visit: Payer: Self-pay | Admitting: Internal Medicine

## 2021-09-18 DIAGNOSIS — E782 Mixed hyperlipidemia: Secondary | ICD-10-CM

## 2021-09-18 DIAGNOSIS — E559 Vitamin D deficiency, unspecified: Secondary | ICD-10-CM

## 2021-09-18 MED ORDER — VITAMIN D (ERGOCALCIFEROL) 1.25 MG (50000 UNIT) PO CAPS: 50000.0000 [IU] | ORAL_CAPSULE | ORAL | 1 refills | Status: DC

## 2021-09-18 MED ORDER — ATORVASTATIN CALCIUM 80 MG PO TABS: 80.0000 mg | ORAL_TABLET | Freq: Every day | ORAL | 3 refills | Status: DC

## 2021-09-18 MED ORDER — ICOSAPENT ETHYL 1 G PO CAPS: 2.0000 g | ORAL_CAPSULE | Freq: Two times a day (BID) | ORAL | 3 refills | Status: DC

## 2021-09-19 LAB — CBC WITH DIFFERENTIAL/PLATELET
Basophils Absolute: 0.1 10*3/uL (ref 0.0–0.2)
Basos: 2 %
EOS (ABSOLUTE): 0.4 10*3/uL (ref 0.0–0.4)
Eos: 12 %
Hematocrit: 33.3 % — ABNORMAL LOW (ref 37.5–51.0)
Hemoglobin: 11.3 g/dL — ABNORMAL LOW (ref 13.0–17.7)
Immature Grans (Abs): 0 10*3/uL (ref 0.0–0.1)
Immature Granulocytes: 0 %
Lymphocytes Absolute: 0.9 10*3/uL (ref 0.7–3.1)
Lymphs: 29 %
MCH: 30.5 pg (ref 26.6–33.0)
MCHC: 33.9 g/dL (ref 31.5–35.7)
MCV: 90 fL (ref 79–97)
Monocytes Absolute: 0.3 10*3/uL (ref 0.1–0.9)
Monocytes: 10 %
Neutrophils Absolute: 1.6 10*3/uL (ref 1.4–7.0)
Neutrophils: 47 %
Platelets: 226 10*3/uL (ref 150–450)
RBC: 3.71 x10E6/uL — ABNORMAL LOW (ref 4.14–5.80)
RDW: 14.1 % (ref 11.6–15.4)
WBC: 3.3 10*3/uL — ABNORMAL LOW (ref 3.4–10.8)

## 2021-09-19 LAB — CMP14+EGFR
ALT: 18 IU/L (ref 0–44)
AST: 27 IU/L (ref 0–40)
Albumin/Globulin Ratio: 1.8 (ref 1.2–2.2)
Albumin: 4.7 g/dL (ref 3.8–4.8)
Alkaline Phosphatase: 127 IU/L — ABNORMAL HIGH (ref 44–121)
BUN/Creatinine Ratio: 24 (ref 10–24)
BUN: 27 mg/dL (ref 8–27)
Bilirubin Total: 0.3 mg/dL (ref 0.0–1.2)
CO2: 19 mmol/L — ABNORMAL LOW (ref 20–29)
Calcium: 9.8 mg/dL (ref 8.6–10.2)
Chloride: 94 mmol/L — ABNORMAL LOW (ref 96–106)
Creatinine, Ser: 1.14 mg/dL (ref 0.76–1.27)
Globulin, Total: 2.6 g/dL (ref 1.5–4.5)
Glucose: 131 mg/dL — ABNORMAL HIGH (ref 70–99)
Potassium: 4.2 mmol/L (ref 3.5–5.2)
Sodium: 134 mmol/L (ref 134–144)
Total Protein: 7.3 g/dL (ref 6.0–8.5)
eGFR: 69 mL/min/{1.73_m2} (ref >59)

## 2021-09-19 LAB — LIPID PANEL
Chol/HDL Ratio: 11.2 ratio — ABNORMAL HIGH (ref 0.0–5.0)
Cholesterol, Total: 257 mg/dL — ABNORMAL HIGH (ref 100–199)
HDL: 23 mg/dL — ABNORMAL LOW (ref >39)
Triglycerides: 2002 mg/dL (ref 0–149)

## 2021-09-19 LAB — TSH: TSH: 1.24 u[IU]/mL (ref 0.450–4.500)

## 2021-09-19 LAB — VITAMIN D 25 HYDROXY (VIT D DEFICIENCY, FRACTURES): Vit D, 25-Hydroxy: 23 ng/mL — ABNORMAL LOW (ref 30.0–100.0)

## 2021-09-19 LAB — HEPATITIS C ANTIBODY: Hep C Virus Ab: NONREACTIVE

## 2021-09-19 LAB — HEMOGLOBIN A1C
Est. average glucose Bld gHb Est-mCnc: 131 mg/dL
Hgb A1c MFr Bld: 6.2 % — ABNORMAL HIGH (ref 4.8–5.6)

## 2021-10-06 ENCOUNTER — Encounter: Payer: Self-pay | Admitting: Gastroenterology

## 2021-10-06 ENCOUNTER — Ambulatory Visit (INDEPENDENT_AMBULATORY_CARE_PROVIDER_SITE_OTHER): Payer: Medicare Other | Admitting: Gastroenterology

## 2021-10-06 VITALS — BP 108/54 | HR 73 | Temp 96.6°F | Ht 68.0 in | Wt 158.4 lb

## 2021-10-06 DIAGNOSIS — K760 Fatty (change of) liver, not elsewhere classified: Secondary | ICD-10-CM | POA: Diagnosis not present

## 2021-10-06 DIAGNOSIS — K219 Gastro-esophageal reflux disease without esophagitis: Secondary | ICD-10-CM

## 2021-10-06 DIAGNOSIS — D649 Anemia, unspecified: Secondary | ICD-10-CM

## 2021-10-06 DIAGNOSIS — F1020 Alcohol dependence, uncomplicated: Secondary | ICD-10-CM | POA: Diagnosis not present

## 2021-10-06 NOTE — Progress Notes (Signed)
? ? ? ?GI Office Note   ? ?Referring Provider: Curlene Labrum, MD ?Primary Care Physician:  Lindell Spar, MD  ?Primary Gastroenterologist: Garfield Cornea, MD ? ?Chief Complaint  ? ?Chief Complaint  ?Patient presents with  ? Follow-up  ? ? ?History of Present Illness  ? ?Gabriel Hammond is a 71 y.o. male, Gabriel Hammond, presenting today for follow up. Last seen in 12/2020. History of chronic normocytic anemia, epigastric pain, alcohol abuse, colon polyps, fatty liver.  Patient has a history of elevated ferritin in the setting of heavy alcohol abuse, negative hemochromatosis genetic screen.  ? ?Presents with Education officer, museum, Scientist, research (life sciences). ? ?Since his last visit he was noted to have triglycerides over 2000.  He was started on Vascepa.  Atorvastatin was also increased due to total cholesterol of 257, LDL cannot be calculated due to high triglycerides.  Patient is trying to cut back on greasy/fried foods.  He states he does not recall having elevated triglycerides in the past.  Last triglyceride level I can see was from 6 years ago and was 188. ? ?From a GI standpoint, his heartburn is well controlled on pantoprazole 40 mg twice daily.  No dysphagia.  Bowel movements are regular.  No melena or rectal bleeding.  Denies abdominal pain.  Continues to drink alcohol 4 to 5 days/week, "just a little".  He will not quantify how much but he is drinking beer and vodka. ?   ?Right upper quadrant ultrasound July 2020: ?-Hepatic steatosis ?  ?EGD April 2022: ?-Severe erosive/ulcerative reflux esophagitis gastric erythema and erosions?status post ?Biopsy (mild reactive gastropathy, negative for H. pylori) ?-Small hiatal hernia. ?-Normal duodenal bulb and second portion of the duodenum. ?-No stigmata of chronic liver disease found on today's examination. ?  ?Colonoscopy November 2019: ?-three 4 to 6 mm polyps removed, 2 were tubular adenomas. ?-Next colonoscopy in 5 years ? ? ? ? ? ?Medications  ? ?Current Outpatient Medications   ?Medication Sig Dispense Refill  ? ASPIRIN ADULT LOW STRENGTH 81 MG EC tablet TAKE 1 TABLET BY MOUTH ONCE A DAY. 30 tablet 6  ? atorvastatin (LIPITOR) 80 MG tablet Take 1 tablet (80 mg total) by mouth daily. 90 tablet 3  ? carvedilol (COREG) 25 MG tablet TAKE 1 TABLET BY MOUTH TWICE A DAY. 120 tablet 3  ? diclofenac Sodium (VOLTAREN) 1 % GEL Apply 4 g topically 4 (four) times daily. 100 g 2  ? ENTRESTO 97-103 MG TAKE 1 TABLET BY MOUTH TWICE A DAY. 120 tablet 3  ? hydrochlorothiazide (MICROZIDE) 12.5 MG capsule TAKE 1 CAPSULE BY MOUTH DAILY. 90 capsule 3  ? icosapent Ethyl (VASCEPA) 1 g capsule Take 2 capsules (2 g total) by mouth 2 (two) times daily. 120 capsule 3  ? pantoprazole (PROTONIX) 40 MG tablet TAKE 1 TABLET BY MOUTH TWICE A DAY 60 tablet 0  ? potassium chloride SA (KLOR-CON M) 20 MEQ tablet TAKE 1 TABLET BY MOUTH ONCE A DAY. 90 tablet 3  ? Vitamin D, Ergocalciferol, (DRISDOL) 1.25 MG (50000 UNIT) CAPS capsule Take 1 capsule (50,000 Units total) by mouth every 7 (seven) days. 12 capsule 1  ? ?No current facility-administered medications for this visit.  ? ? ?Allergies  ? ?Allergies as of 10/06/2021  ? (No Known Allergies)  ? ?   ?  ? ?Review of Systems  ? ?General: Negative for anorexia, weight loss, fever, chills, fatigue, weakness. ?ENT: Negative for hoarseness, difficulty swallowing , nasal congestion. ?CV: Negative for chest pain, angina, palpitations,  dyspnea on exertion, peripheral edema.  ?Respiratory: Negative for dyspnea at rest, dyspnea on exertion, cough, sputum, wheezing.  ?GI: See history of present illness. ?GU:  Negative for dysuria, hematuria, urinary incontinence, urinary frequency, nocturnal urination.  ?Endo: Negative for unusual weight change.  ?   ?Physical Exam  ? ?BP (!) 108/54 (BP Location: Right Arm, Patient Position: Sitting, Cuff Size: Normal)   Pulse 73   Temp (!) 96.6 ?F (35.9 ?C) (Temporal)   Ht '5\' 8"'$  (1.727 m)   Wt 158 lb 6.4 oz (71.8 kg)   SpO2 98%   BMI 24.08 kg/m?  ?   ?General: Well-nourished, well-developed in no acute distress.  Very pleasant and conversant. ?Eyes: No icterus. ?Mouth: Oropharyngeal mucosa moist and pink , no lesions erythema or exudate. ?Lungs: Clear to auscultation bilaterally.  ?Heart: Regular rate and rhythm, no murmurs rubs or gallops.  ?Abdomen: Bowel sounds are normal, nontender, nondistended, no hepatosplenomegaly or masses,  ?no abdominal bruits or hernia , no rebound or guarding.  ?Rectal: not performed  ?Extremities: No lower extremity edema. No clubbing or deformities. ?Neuro: Alert and oriented x 4   ?Skin: Warm and dry, no jaundice.   ?Psych: Alert and cooperative, normal mood and affect. ? ?Labs  ? ?Lab Results  ?Component Value Date  ? CREATININE 1.14 09/17/2021  ? BUN 27 09/17/2021  ? NA 134 09/17/2021  ? K 4.2 09/17/2021  ? CL 94 (L) 09/17/2021  ? CO2 19 (L) 09/17/2021  ? ?Lab Results  ?Component Value Date  ? ALT 18 09/17/2021  ? AST 27 09/17/2021  ? ALKPHOS 127 (H) 09/17/2021  ? BILITOT 0.3 09/17/2021  ? ?Lab Results  ?Component Value Date  ? WBC 3.3 (L) 09/17/2021  ? HGB 11.3 (L) 09/17/2021  ? HCT 33.3 (L) 09/17/2021  ? MCV 90 09/17/2021  ? PLT 226 09/17/2021  ? ?Lab Results  ?Component Value Date  ? IRON 46 09/25/2019  ? TIBC 367 09/25/2019  ? FERRITIN 691 (H) 12/03/2019  ? ?Lab Results  ?Component Value Date  ? [id number] 502 02/27/2020  ? ?Lab Results  ?Component Value Date  ? FOLATE 7.7 02/27/2020  ? ?Lab Results  ?Component Value Date  ? HGBA1C 6.2 (H) 09/17/2021  ? ?Triglycerides 2,002 on 09/17/21.  ? ?Imaging Studies  ? ?No results found. ? ?Assessment  ? ?GERD: h/o severe reflux esophagitis.  Clinically doing well.  Given history of significant disease and ongoing alcohol use, we will maintain twice daily dosing.  Reinforced antireflux measures. ? ?Chronic anemia: likely anemia of chronic disease. Hgb stable in the 11 range. Check B12, folate, iron studies again. Last checked in 2021. Ongoing etoh abuse puts at risk of nutritional  deficiencies.  ? ?Fatty liver: at risk of cirrhosis. Brother died of cirrhosis.  Recently hepatitis C antibody negative.  Will check for hepatitis A and B immunity and vaccinate if needed.  Last imaging in July 2020.  Consider updating ultrasound with elastography at some point between now and his next visit.  Await labs. ? ?Markedly elevated triglycerides: Greater than 2000.  Try to make dietary changes.  Vascepa added.  At increased risk of pancreatitis based on triglyceride levels alone but also with combination of alcohol, increases risk further.  Discussed with patient today. ? ?PLAN  ? ?Pantoprazole '40mg'$  BID. ?B12/folate.  Hepatitis A + B serologies. ?Cut back on alcohol use.  Ultimate goal of stopping but patient not interested at this time. ?Continue to cut back on greasy/fried foods. ?Return to  the office in 6 months.  He will be due for colonoscopy next year unless something comes up with his labs that prompted earlier intervention. ? ? ?Laureen Ochs. Michaelpaul Apo, MHS, PA-C ?[facility] ? ?

## 2021-10-06 NOTE — Patient Instructions (Addendum)
Please go to [place] for labs. We will be in touch with results within 5-7 business days. ?Try to cut back on alcohol use of all kinds. You are at risk of liver cirrhosis and pancreatitis.  ?Continue to cut back on greasy foods and fried foods to get your triglycerides under control.  ?Continue pantoprazole '40mg'$  twice daily before meals.  ?Return to the office in six months.  ?

## 2021-10-07 LAB — IRON,TIBC AND FERRITIN PANEL
Ferritin: 2036 ng/mL — ABNORMAL HIGH (ref 30–400)
Iron Saturation: 69 % — ABNORMAL HIGH (ref 15–55)
Iron: 176 ug/dL — ABNORMAL HIGH (ref 38–169)
Total Iron Binding Capacity: 256 ug/dL (ref 250–450)
UIBC: 80 ug/dL — ABNORMAL LOW (ref 111–343)

## 2021-10-07 LAB — B12 AND FOLATE PANEL
Folate: 3.4 ng/mL (ref >3.0)
Vitamin B-12: 423 pg/mL (ref 232–1245)

## 2021-10-07 LAB — HEPATITIS B SURFACE ANTIBODY,QUALITATIVE: Hep B Surface Ab, Qual: NONREACTIVE

## 2021-10-07 LAB — HEPATITIS B SURFACE ANTIGEN: Hepatitis B Surface Ag: NEGATIVE

## 2021-10-07 LAB — HEPATITIS A ANTIBODY, TOTAL: hep A Total Ab: NEGATIVE

## 2021-10-26 ENCOUNTER — Ambulatory Visit (INDEPENDENT_AMBULATORY_CARE_PROVIDER_SITE_OTHER): Payer: Medicare Other

## 2021-10-26 DIAGNOSIS — Z0000 Encounter for general adult medical examination without abnormal findings: Secondary | ICD-10-CM
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
## 2021-10-26 NOTE — Patient Instructions (Signed)
  [name] , Thank you for taking time to come for your Medicare Wellness Visit. I appreciate your ongoing commitment to your health goals. Please review the following plan we discussed and let me know if I can assist you in the future.   Record shows colonoscopy not due to be repeated until 2024 so you are currently up to date.   You can get the 2nd shingrix vaccine at the pharmacy.    These are the goals we discussed:  Goals      Patient Stated     Get 2nd shingles vaccine      Prevent falls        This is a list of the screening recommended for you and due dates:  Health Maintenance  Topic Date Due   Zoster (Shingles) Vaccine (1 of 2) Never done   COVID-19 Vaccine (5 - Booster for Pfizer series) 06/16/2021   Flu Shot  01/05/2022   Tetanus Vaccine  01/29/2028   Colon Cancer Screening  04/13/2028   Pneumonia Vaccine  Completed   Hepatitis C Screening: USPSTF Recommendation to screen - Ages 18-79 yo.  Completed   HPV Vaccine  Aged Out

## 2021-10-28 ENCOUNTER — Other Ambulatory Visit: Payer: Self-pay

## 2021-10-28 DIAGNOSIS — R7989 Other specified abnormal findings of blood chemistry: Secondary | ICD-10-CM

## 2021-10-28 DIAGNOSIS — D509 Iron deficiency anemia, unspecified: Secondary | ICD-10-CM

## 2021-11-13 ENCOUNTER — Ambulatory Visit (HOSPITAL_COMMUNITY)
Admission: RE | Admit: 2021-11-13 | Discharge: 2021-11-13 | Disposition: A | Discharge status: Home / Self Care | Payer: Medicare Other | Source: Ambulatory Visit | Attending: Gastroenterology | Admitting: Gastroenterology

## 2021-11-13 DIAGNOSIS — R7989 Other specified abnormal findings of blood chemistry: Secondary | ICD-10-CM | POA: Insufficient documentation

## 2021-11-13 MED ORDER — GADOBUTROL 1 MMOL/ML IV SOLN
7.0000 mL | Freq: Once | INTRAVENOUS | Status: AC | PRN
  Administered 2021-11-13: 7 mL via INTRAVENOUS

## 2021-11-17 ENCOUNTER — Other Ambulatory Visit: Payer: Self-pay | Admitting: Internal Medicine

## 2021-12-09 ENCOUNTER — Other Ambulatory Visit: Payer: Self-pay

## 2021-12-09 DIAGNOSIS — D509 Iron deficiency anemia, unspecified: Secondary | ICD-10-CM

## 2021-12-09 DIAGNOSIS — R7989 Other specified abnormal findings of blood chemistry: Secondary | ICD-10-CM

## 2021-12-18 ENCOUNTER — Telehealth: Payer: Self-pay | Admitting: Internal Medicine

## 2021-12-18 NOTE — Telephone Encounter (Signed)
Please call Anderson Malta at [facility] regarding patient's fasting blood work. [phone] ext [phone]

## 2021-12-22 NOTE — Telephone Encounter (Signed)
Returned Jennifer's call informing her of where to take patient to have fasting labs done.

## 2021-12-26 LAB — IRON,TIBC AND FERRITIN PANEL
Ferritin: 687 ng/mL — ABNORMAL HIGH (ref 30–400)
Iron Saturation: 22 % (ref 15–55)
Iron: 75 ug/dL (ref 38–169)
Total Iron Binding Capacity: 339 ug/dL (ref 250–450)
UIBC: 264 ug/dL (ref 111–343)

## 2021-12-26 LAB — FOLATE: Folate: 12.4 ng/mL (ref >3.0)

## 2022-01-05 ENCOUNTER — Other Ambulatory Visit: Payer: Self-pay | Admitting: Internal Medicine

## 2022-01-05 DIAGNOSIS — E782 Mixed hyperlipidemia: Secondary | ICD-10-CM

## 2022-01-06 ENCOUNTER — Other Ambulatory Visit: Payer: Self-pay | Admitting: Cardiology

## 2022-02-04 ENCOUNTER — Other Ambulatory Visit: Payer: Self-pay | Admitting: Internal Medicine

## 2022-02-04 DIAGNOSIS — E559 Vitamin D deficiency, unspecified: Secondary | ICD-10-CM

## 2022-03-08 ENCOUNTER — Other Ambulatory Visit: Payer: Self-pay | Admitting: Internal Medicine

## 2022-03-08 ENCOUNTER — Other Ambulatory Visit: Payer: Self-pay | Admitting: Cardiology

## 2022-03-08 DIAGNOSIS — E559 Vitamin D deficiency, unspecified: Secondary | ICD-10-CM

## 2022-03-24 ENCOUNTER — Ambulatory Visit (INDEPENDENT_AMBULATORY_CARE_PROVIDER_SITE_OTHER): Payer: Medicare Other | Admitting: Internal Medicine

## 2022-03-24 ENCOUNTER — Encounter: Payer: Self-pay | Admitting: Internal Medicine

## 2022-03-24 VITALS — BP 128/70 | HR 60 | Ht 68.0 in | Wt 167.0 lb

## 2022-03-24 DIAGNOSIS — M170 Bilateral primary osteoarthritis of knee: Secondary | ICD-10-CM | POA: Diagnosis not present

## 2022-03-24 DIAGNOSIS — Z0001 Encounter for general adult medical examination with abnormal findings: Secondary | ICD-10-CM

## 2022-03-24 DIAGNOSIS — Z23 Encounter for immunization: Secondary | ICD-10-CM

## 2022-03-24 DIAGNOSIS — E782 Mixed hyperlipidemia: Secondary | ICD-10-CM

## 2022-03-24 DIAGNOSIS — E559 Vitamin D deficiency, unspecified: Secondary | ICD-10-CM

## 2022-03-24 DIAGNOSIS — I5022 Chronic systolic (congestive) heart failure: Secondary | ICD-10-CM | POA: Diagnosis not present

## 2022-03-24 DIAGNOSIS — R7303 Prediabetes: Secondary | ICD-10-CM

## 2022-03-24 DIAGNOSIS — Z125 Encounter for screening for malignant neoplasm of prostate: Secondary | ICD-10-CM

## 2022-03-24 MED ORDER — DICLOFENAC SODIUM 1 % EX GEL: 4.0000 g | Freq: Four times a day (QID) | CUTANEOUS | 3 refills | Status: AC

## 2022-03-24 NOTE — Assessment & Plan Note (Signed)
Lab Results  Component Value Date   HGBA1C 6.2 (H) 09/17/2021   Advised to follow low carb diet for now

## 2022-03-24 NOTE — Patient Instructions (Signed)
Please continue taking medications as prescribed.  Please continue to follow low salt diet and ambulate as tolerated.  Please try to keep legs elevated while sitting at home for leg swelling.

## 2022-03-24 NOTE — Assessment & Plan Note (Signed)
-   echo 11/2014 showed LVEF 10-15%, diffuse hypokinesis, restrictive diastolic fyunction - cath 01/2015 without significant CAD. RHC with CI 2, mean PA 31, PCWP 22.  - 09/2015 echo: LVEF 37%, grade I diasotlic dysfunction 09/4512 echo: LVEF 50-55%  On Entresto, Coreg and HCTZ BP low normal Appears euvolemic currently Followed by cardiology
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
## 2022-03-24 NOTE — Assessment & Plan Note (Signed)
Physical exam as documented. Fasting blood tests today. Flu vaccine today. He is up-to-date with pneumococcal and Shingrix vaccines.

## 2022-03-24 NOTE — Assessment & Plan Note (Signed)
Tylenol as needed Voltaren gel as needed prescribed

## 2022-03-24 NOTE — Assessment & Plan Note (Addendum)
On statin Check lipid profile 

## 2022-03-24 NOTE — Assessment & Plan Note (Signed)
Ordered PSA after discussing its limitations for prostate cancer screening, including false positive results leading additional investigations. 

## 2022-03-24 NOTE — Assessment & Plan Note (Signed)
On vitamin D 50,000 IU qw

## 2022-04-06 ENCOUNTER — Other Ambulatory Visit: Payer: Self-pay | Admitting: Cardiology

## 2022-04-06 LAB — CMP14+EGFR
ALT: 10 IU/L (ref 0–44)
AST: 14 IU/L (ref 0–40)
Albumin/Globulin Ratio: 1.6 (ref 1.2–2.2)
Albumin: 4.6 g/dL (ref 3.9–4.9)
Alkaline Phosphatase: 138 IU/L — ABNORMAL HIGH (ref 44–121)
BUN/Creatinine Ratio: 10 (ref 10–24)
BUN: 11 mg/dL (ref 8–27)
Bilirubin Total: 0.3 mg/dL (ref 0.0–1.2)
CO2: 20 mmol/L (ref 20–29)
Calcium: 9.9 mg/dL (ref 8.6–10.2)
Chloride: 94 mmol/L — ABNORMAL LOW (ref 96–106)
Creatinine, Ser: 1.05 mg/dL (ref 0.76–1.27)
Globulin, Total: 2.9 g/dL (ref 1.5–4.5)
Glucose: 127 mg/dL — ABNORMAL HIGH (ref 70–99)
Potassium: 3.9 mmol/L (ref 3.5–5.2)
Sodium: 133 mmol/L — ABNORMAL LOW (ref 134–144)
Total Protein: 7.5 g/dL (ref 6.0–8.5)
eGFR: 76 mL/min/{1.73_m2} (ref >59)

## 2022-04-06 LAB — CBC WITH DIFFERENTIAL/PLATELET
Basophils Absolute: 0.1 10*3/uL (ref 0.0–0.2)
Basos: 1 %
EOS (ABSOLUTE): 0.5 10*3/uL — ABNORMAL HIGH (ref 0.0–0.4)
Eos: 11 %
Hematocrit: 34.2 % — ABNORMAL LOW (ref 37.5–51.0)
Hemoglobin: 11.9 g/dL — ABNORMAL LOW (ref 13.0–17.7)
Immature Grans (Abs): 0 10*3/uL (ref 0.0–0.1)
Immature Granulocytes: 0 %
Lymphocytes Absolute: 0.8 10*3/uL (ref 0.7–3.1)
Lymphs: 16 %
MCH: 29.2 pg (ref 26.6–33.0)
MCHC: 34.8 g/dL (ref 31.5–35.7)
MCV: 84 fL (ref 79–97)
Monocytes Absolute: 0.5 10*3/uL (ref 0.1–0.9)
Monocytes: 10 %
Neutrophils Absolute: 3.1 10*3/uL (ref 1.4–7.0)
Neutrophils: 62 %
Platelets: 236 10*3/uL (ref 150–450)
RBC: 4.08 x10E6/uL — ABNORMAL LOW (ref 4.14–5.80)
RDW: 14.9 % (ref 11.6–15.4)
WBC: 4.9 10*3/uL (ref 3.4–10.8)

## 2022-04-06 LAB — PSA: Prostate Specific Ag, Serum: 1.7 ng/mL (ref 0.0–4.0)

## 2022-04-06 LAB — HEMOGLOBIN A1C
Est. average glucose Bld gHb Est-mCnc: 137 mg/dL
Hgb A1c MFr Bld: 6.4 % — ABNORMAL HIGH (ref 4.8–5.6)

## 2022-04-06 LAB — VITAMIN D 25 HYDROXY (VIT D DEFICIENCY, FRACTURES)

## 2022-04-06 LAB — LIPID PANEL
Chol/HDL Ratio: 6.1 ratio — ABNORMAL HIGH (ref 0.0–5.0)
Cholesterol, Total: 128 mg/dL (ref 100–199)
HDL: 21 mg/dL — ABNORMAL LOW (ref >39)
Triglycerides: 1296 mg/dL (ref 0–149)

## 2022-04-06 LAB — TSH: TSH: 1.14 u[IU]/mL (ref 0.450–4.500)

## 2022-04-09 ENCOUNTER — Other Ambulatory Visit: Payer: Self-pay

## 2022-04-09 ENCOUNTER — Encounter: Payer: Self-pay | Admitting: Gastroenterology

## 2022-04-09 ENCOUNTER — Ambulatory Visit (INDEPENDENT_AMBULATORY_CARE_PROVIDER_SITE_OTHER): Payer: Medicare Other | Admitting: Gastroenterology

## 2022-04-09 VITALS — BP 129/62 | HR 77 | Temp 97.4°F | Ht 68.0 in | Wt 168.0 lb

## 2022-04-09 DIAGNOSIS — R7989 Other specified abnormal findings of blood chemistry: Secondary | ICD-10-CM

## 2022-04-09 DIAGNOSIS — F1020 Alcohol dependence, uncomplicated: Secondary | ICD-10-CM

## 2022-04-09 DIAGNOSIS — K2100 Gastro-esophageal reflux disease with esophagitis, without bleeding: Secondary | ICD-10-CM | POA: Diagnosis not present

## 2022-04-09 DIAGNOSIS — D509 Iron deficiency anemia, unspecified: Secondary | ICD-10-CM

## 2022-04-09 DIAGNOSIS — K760 Fatty (change of) liver, not elsewhere classified: Secondary | ICD-10-CM | POA: Diagnosis not present

## 2022-04-09 DIAGNOSIS — D649 Anemia, unspecified: Secondary | ICD-10-CM

## 2022-04-09 NOTE — Patient Instructions (Signed)
You can stop taking folic acid for now. We will recheck labs in two months to see if your numbers hold up.  Continue to cut back on your alcohol. Try to drink no more than two 12 ounce beers daily. Would prefer less, but you really need to try and limit to 2. Cut back on fatty/greasy foods and cut back on sweets.  We will recheck folic acid along with liver labs in 2 months.  We will find out from [place] if you received your Hepatitis A and B vaccinations.  Return to the office in six months. We will set you up for your next colonoscopy at that time.

## 2022-04-09 NOTE — Progress Notes (Signed)
GI Office Note    Referring Provider: Lindell Spar, MD Primary Care Physician:  Lindell Spar, MD  Primary Gastroenterologist: Garfield Cornea, MD   Chief Complaint   Chief Complaint  Patient presents with   Follow-up    No issues, would like to know if cholesterol is high.    History of Present Illness   Gabriel Hammond is a 71 y.o. male presenting today for follow-up.  Last seen in May 2023.  He has a history of chronic normocytic anemia, epigastric pain, alcohol abuse, colon polyps, fatty liver.  He has a history of elevated ferritin in the setting of heavy alcohol abuse, negative hemochromatosis genetic screening.  History of triglycerides over 2000, started on Vascepa.  Atorvastatin increased due to total cholesterol 257.  Recent labs March 24, 2022: Triglycerides 1296 (down from 2002), total cholesterol 128 (down from 257).  LDL cannot be calculated in the setting of high triglycerides.  Hemoglobin 11.9 improved from 11.3 six months prior.  Platelets normal.  LFTs normal except for alkaline phosphatase of 138 entheses up from 127 six months prior).  A1c 6.4.  Today: Weight is up 10 pounds since last office visit.  Patient has no complaints.  No abdominal pain.  Appetite is good.  Bowel movements regular.  No blood in the stool or melena.  No heartburn, dysphagia, vomiting.  Continues to drink 2-3 twelve ounce beers daily.  Trying to cut back on greasy and fatty foods.  States he needs to cut back on his sweets.  He is not sure if he has received hepatitis A and B vaccinations as previously recommended back in May.  EGD April 2022: -Severe erosive/ulcerative reflux esophagitis gastric erythema and erosions status post biopsy (mild reactive gastropathy, negative for H. pylori) -Small hiatal hernia. -Normal duodenal bulb and second portion of the duodenum. -No stigmata of chronic liver disease found on today's examination.   Colonoscopy November 2019: -three 4 to 6 mm  polyps removed, 2 were tubular adenomas. -Next colonoscopy in 5 years  Medications   Current Outpatient Medications  Medication Sig Dispense Refill   aspirin EC (ASPIRIN ADULT LOW STRENGTH) 81 MG tablet TAKE 1 TABLET BY MOUTH ONCE A DAY. 30 tablet 5   atorvastatin (LIPITOR) 80 MG tablet Take 1 tablet (80 mg total) by mouth daily. 90 tablet 3   carvedilol (COREG) 25 MG tablet TAKE 1 TABLET BY MOUTH TWICE A DAY. 60 tablet 5   diclofenac Sodium (VOLTAREN) 1 % GEL Apply 4 g topically 4 (four) times daily. 100 g 3   ENTRESTO 97-103 MG TAKE 1 TABLET BY MOUTH TWICE A DAY. 60 tablet 10   hydrochlorothiazide (MICROZIDE) 12.5 MG capsule TAKE 1 CAPSULE BY MOUTH DAILY. 90 capsule 3   icosapent Ethyl (VASCEPA) 1 g capsule TAKE (2) CAPSULES BY MOUTH TWICE DAILY. 120 capsule 5   pantoprazole (PROTONIX) 40 MG tablet TAKE 1 TABLET BY MOUTH TWICE A DAY 60 tablet 5   potassium chloride SA (KLOR-CON M) 20 MEQ tablet TAKE 1 TABLET BY MOUTH ONCE A DAY. 90 tablet 3   Vitamin D, Ergocalciferol, (DRISDOL) 1.25 MG (50000 UNIT) CAPS capsule TAKE 1 CAPSULE BY MOUTH ONCE WEEKLY 4 capsule 10   No current facility-administered medications for this visit.    Allergies   Allergies as of 04/09/2022   (No Known Allergies)      Review of Systems   General: Negative for anorexia, weight loss, fever, chills, fatigue, weakness. ENT: Negative for  hoarseness, difficulty swallowing , nasal congestion. CV: Negative for chest pain, angina, palpitations, dyspnea on exertion, peripheral edema.  Respiratory: Negative for dyspnea at rest, dyspnea on exertion, cough, sputum, wheezing.  GI: See history of present illness. GU:  Negative for dysuria, hematuria, urinary incontinence, urinary frequency, nocturnal urination.  Endo: Negative for unusual weight change.     Physical Exam   BP 129/62 (BP Location: Right Arm, Patient Position: Sitting, Cuff Size: Normal)   Pulse 77   Temp (!) 97.4 F (36.3 C) (Oral)   Ht '5\' 8"'$   (1.727 m)   Wt 168 lb (76.2 kg)   SpO2 98%   BMI 25.54 kg/m    General: Well-nourished, well-developed in no acute distress.  Eyes: No icterus. Mouth: Oropharyngeal mucosa moist and pink , no lesions erythema or exudate. Lungs: Clear to auscultation bilaterally.  Heart: Regular rate and rhythm, no murmurs rubs or gallops.  Abdomen: Bowel sounds are normal, nontender, nondistended, no hepatosplenomegaly or masses,  no abdominal bruits or hernia , no rebound or guarding.  Rectal: Not performed Extremities: No lower extremity edema. No clubbing or deformities. Neuro: Alert and oriented x 4   Skin: Warm and dry, no jaundice.   Psych: Alert and cooperative, normal mood and affect.  Labs   See hpi  Imaging Studies   No results found.  Assessment   GERD: History of severe reflux esophagitis.  Currently doing well.  Chronic anemia: Labs in 10/2021, B12 normal, folate low normal, ferritin 2000.  Added folic acid 384 mg daily.  Repeat folate in 12/2021 was 12.4. Most recent CBC with Hgb 11.9 improved. Will continue to monitor.  Chronic alcohol use likely contributing.  Fatty liver: At risk of cirrhosis given ongoing etoh use, FH (Brother died from cirrhosis). He has had chronically elevated ferritin but negative hemochromatosis genetic markers.  Likely elevated ferritin in the setting of EtOH abuse.  Recheck fasting labs in July showed improved ferritin of 687.  He recommended vaccination to hepatitis a and B due to lack of immunity and chronic liver disease. MRI showed moderate enlargement of the caudate lobe but otherwise no morphologic changes suggestive of cirrhosis.  No significant iron deposition identified in the liver.  His triglycerides remain significantly elevated but much improved.  His total cholesterol level is much improved.  LFTs normal except for mildly elevated alkaline phosphatase which is new over the past 6 months.   PLAN   Colonoscopy planned for next year.  Stop  folic acid for now.  We will recheck his levels in a couple of months to determine if he needs chronic supplement. Put back on fatty/greasy foods as well as sweets. We will find out if he has received hepatitis a and B vaccinations from his Education officer, museum, [place]. Recheck labs in 2 months including weight, CBC, LFTs, GGT, AMA. Return office visit in six months.   Laureen Ochs. Bobby Rumpf, [place], [facility]

## 2022-05-05 ENCOUNTER — Other Ambulatory Visit: Payer: Self-pay | Admitting: Gastroenterology

## 2022-05-28 ENCOUNTER — Other Ambulatory Visit: Payer: Self-pay

## 2022-05-28 DIAGNOSIS — D509 Iron deficiency anemia, unspecified: Secondary | ICD-10-CM

## 2022-05-28 DIAGNOSIS — R7989 Other specified abnormal findings of blood chemistry: Secondary | ICD-10-CM

## 2022-07-06 ENCOUNTER — Other Ambulatory Visit: Payer: Self-pay | Admitting: Cardiology

## 2022-07-07 ENCOUNTER — Other Ambulatory Visit: Payer: Self-pay | Admitting: Cardiology

## 2022-07-07 ENCOUNTER — Other Ambulatory Visit: Payer: Self-pay | Admitting: Internal Medicine

## 2022-07-07 DIAGNOSIS — E782 Mixed hyperlipidemia: Secondary | ICD-10-CM

## 2022-08-10 ENCOUNTER — Other Ambulatory Visit: Payer: Self-pay | Admitting: Internal Medicine

## 2022-08-10 DIAGNOSIS — E782 Mixed hyperlipidemia: Secondary | ICD-10-CM

## 2022-09-01 NOTE — Progress Notes (Signed)
Cardiology Office Note:    Date:  09/13/2022   ID:  Gabriel Hammond, DOB [birth year], MRN [id number]  PCP:  Gabriel Halon, MD  [facility] Providers Cardiologist:  Gabriel Rich, MD     Referring MD: Gabriel Halon, MD   Chief Complaint:  No chief complaint on file.     History of Present Illness:   Gabriel Hammond is a 72 y.o. male with history of nonischemic cardiomyopathy ejection fraction 10 to 15% with diffuse hypokinesis 11/2014 mild CAD on cath, EF improved to 50 to 55% with grade 1 DD on echo 09/2017, hypertension, hyperlipidemia.  Patient last saw Dr. Wyline Mood 04/2021 was without complaints.  Patient comes in with his Child psychotherapist. He says he's eating a lot of greasy foods, ham hocks etc. Denies chest pain, dyspnea, dizziness, edema. H walks  about 5-10 min per day to his mailbox. He watches a lot of soap operas. Labs reviewed from 03/2022 trig 1296 and LDL couldn't be calculated. Eats a lot of sweets. Now on vascepa.   Drinks- 1/2 pint gin, couples beers daily.       Past Medical History:  Diagnosis Date   Alcohol abuse    Arthritis    Chronic combined systolic and diastolic CHF (congestive heart failure)    a. EF 10 to 15% by echo in 11/2014 with cath showing no significant CAD. b. EF improved to 50-55% by repeat imaging in 09/2017   Fatty liver    Hypercholesterolemia    Hypertension    Mental disorder    Current Medications: Current Meds  Medication Sig   atorvastatin (LIPITOR) 80 MG tablet TAKE ONE TABLET BY MOUTH DAILY.   carvedilol (COREG) 25 MG tablet TAKE 1 TABLET BY MOUTH TWICE A DAY.   diclofenac Sodium (VOLTAREN) 1 % GEL Apply 4 g topically 4 (four) times daily.   ENTRESTO 97-103 MG TAKE 1 TABLET BY MOUTH TWICE A DAY.   hydrochlorothiazide (MICROZIDE) 12.5 MG capsule TAKE 1 CAPSULE BY MOUTH DAILY.   pantoprazole (PROTONIX) 40 MG tablet TAKE 1 TABLET BY MOUTH TWICE A DAY   VASCEPA 1 g capsule TAKE (2) CAPSULES BY MOUTH TWICE DAILY.    [DISCONTINUED] aspirin EC (ASPIRIN ADULT LOW STRENGTH) 81 MG tablet TAKE 1 TABLET BY MOUTH ONCE A DAY.   [DISCONTINUED] potassium chloride SA (KLOR-CON M) 20 MEQ tablet TAKE 1 TABLET BY MOUTH ONCE A DAY.    Allergies:   Patient has no known allergies.   Social History   Tobacco Use   Smoking status: Former    Packs/day: 3.00    Years: 10.00    Additional pack years: 0.00    Total pack years: 30.00    Types: Cigarettes    Quit date: 06/07/2010    Years since quitting: 12.2   Smokeless tobacco: Never  Vaping Use   Vaping Use: Never used  Substance Use Topics   Alcohol use: Yes    Alcohol/week: 0.0 standard drinks of alcohol    Comment: 2-3  12 ounce beers per day, reduction from previous years   Drug use: No    Comment: pt denies adamantly. Social worker states no known history    Family Hx: [name] family history includes Cancer in his brother; Colon cancer in an other family member.  ROS     Physical Exam:    VS:  BP 118/64   Pulse 74   Ht 5\' 8"  (1.727 m)   Wt 171 lb (77.6 kg)  SpO2 97%   BMI 26.00 kg/m     Wt Readings from Last 3 Encounters:  09/13/22 171 lb (77.6 kg)  04/09/22 168 lb (76.2 kg)  03/24/22 167 lb (75.8 kg)    Physical Exam  GEN: Well nourished, well developed, in no acute distress  Neck: no JVD, carotid bruits, or masses Cardiac:RRR; no murmurs, rubs, or gallops  Respiratory:  clear to auscultation bilaterally, normal work of breathing GI: soft, nontender, nondistended, + BS Ext: without cyanosis, clubbing, or edema, Good distal pulses bilaterally Neuro:  Alert and Oriented x 3,  Psych: euthymic mood, full affect        EKGs/Labs/Other Test Reviewed:    EKG:  EKG is   ordered today.  The ekg ordered today demonstrates NSR with poor R wave progression ant  Recent Labs: 03/24/2022: ALT 10; BUN 11; Creatinine, Ser 1.05; Potassium 3.9; Sodium 133; TSH 1.140 09/13/2022: Hemoglobin 11.7; Platelets 231   Recent Lipid Panel Recent Labs     03/24/22 1358  CHOL 128  TRIG 1,296*  HDL 21*  LDLCALC Comment*     Prior CV Studies:   11/2014 echo Study Conclusions  - Left ventricle: The cavity size was normal. Wall thickness was   normal. The estimated ejection fraction was in the range of 10%   to 15%. Diffuse hypokinesis. Doppler parameters are consistent   with restrictive physiology, indicative of decreased left   ventricular diastolic compliance and/or increased left atrial   pressure. - Aortic valve: Mildly calcified annulus. Trileaflet; mildly   thickened leaflets. There was mild regurgitation. Valve area   (VTI): 1.6 cm^2. Valve area (Vmax): 1.73 cm^2. - Mitral valve: Mildly calcified annulus. Mildly thickened leaflets   . There was mild regurgitation. The MR vena contracta is 0.3 cm. - Left atrium: The atrium was severely dilated. - Pulmonary veins: There is systolic blunting of pulmonary vein   flow consistent with elevated LA pressures. - Right ventricle: The ventricular septum is flattened in systole   consistent with RV pressure overload. The cavity size was mildly   dilated. - Right atrium: The atrium was mildly dilated. - Atrial septum: No defect or patent foramen ovale was identified. - Pulmonary arteries: Systolic pressure was moderately increased.   PA peak pressure: 44 mm Hg (S). - Technically adequate study.   01/2015 Cath Prox RCA lesion, 20% stenosed. Mid RCA lesion, 20% stenosed. Ramus lesion, 30% stenosed. Prox LAD lesion, 20% stenosed.   1. Mild non-obstructive CAD 2. Non-ischemic cardiomyopathy (LVEF10-15% by echo. No LV gram performed today).   3. Elevated pressures as above   Recommendations: Medical management of CAD and NICM. Follow up with Dr. Wyline MoodBranch in 2-3 weeks.       09/2015 echo Study Conclusions   - Left ventricle: The cavity size was normal. Wall thickness was   normal. The estimated ejection fraction was 40%. Diffuse   hypokinesis. Doppler parameters are consistent  with abnormal left   ventricular relaxation (grade 1 diastolic dysfunction). - Aortic valve: There was trivial regurgitation. - Mitral valve: Mild thickening. Mild prolapse, involving the   anterior leaflet. There was mild regurgitation. - Right ventricle: Systolic function was mildly reduced. - Right atrium: Central venous pressure (est): 3 mm Hg. - Tricuspid valve: There was trivial regurgitation. - Pulmonary arteries: Systolic pressure could not be accurately   estimated. - Pericardium, extracardiac: There was no pericardial effusion.   Impressions:   - Normal LV wall thickness with LVEF approximately 40%. This has   improved  compared to the previous study in June 2016. Grade 1   diastolic dysfunction with normal estimated LV filling pressure.   Mildly thickened mitral valve with mild prolapse of the anterior   leaflet and mild mitral regurgitation. Trivial aortic   regurgitation. Mildly reduced RV contraction. Trivial tricuspid   regurgitation.     09/2017 echo Study Conclusions   - Left ventricle: The cavity size was normal. Wall thickness was   normal. Systolic function was normal. The estimated ejection   fraction was in the range of 50% to 55%. Wall motion was normal;   there were no regional wall motion abnormalities. Doppler   parameters are consistent with abnormal left ventricular   relaxation (grade 1 diastolic dysfunction). - Aortic valve: There was mild regurgitation. Valve area (VTI):   2.22 cm^2. Valve area (Vmax): 1.81 cm^2. Valve area (Vmean): 1.52   cm^2. - Atrial septum: No defect or patent foramen ovale was identified. - Systemic veins: IVC is small, suggesting low RA pressure and   hypovolemia. - Technicaly adequate study.         Risk Assessment/Calculations/Metrics:              ASSESSMENT & PLAN:   No problem-specific Assessment & Plan notes found for this encounter.   Chronic combined systolic and diastolic CHF last echo 09/2017 LVEF 50 to  55% with grade 1 DD-well compensated. Will check labs today  CAD minimal on cath in 2016-no chest pain  Hypertension well controlled on entresto, HCTZ, coreg  HLD trig 1296, LDL not calculated 03/2022 started on vascepa by PCP also on lipitor. Poor diet choices. Will refer to nutritionist and repeat labs today  ETOH-heavy- 1/2 pint gin plus several beers daily. Discussed importance of reducing alcohol and sugar intake.             Dispo:  No follow-ups on file.   Medication Adjustments/Labs and Tests Ordered: Current medicines are reviewed at length with the patient today.  Concerns regarding medicines are outlined above.  Tests Ordered: Orders Placed This Encounter  Procedures   Lipid Profile   Comprehensive metabolic panel   CBC   Ambulatory Referral to DSME/T   EKG 12-Lead   Medication Changes: Meds ordered this encounter  Medications   aspirin EC (ASPIRIN ADULT LOW STRENGTH) 81 MG tablet    Sig: Take 1 tablet (81 mg total) by mouth daily.    Dispense:  90 tablet    Refill:  3   potassium chloride SA (KLOR-CON M) 20 MEQ tablet    Sig: Take 1 tablet (20 mEq total) by mouth daily.    Dispense:  90 tablet    Refill:  3    Please schedule an appointment for further refills.   Elson Clan, PA-C  09/13/2022 11:56 AM    [facility] [address] [place], [place], [place]  [address] Phone: [phone]; Fax: [phone]

## 2022-09-10 ENCOUNTER — Telehealth: Payer: Self-pay | Admitting: Internal Medicine

## 2022-09-10 NOTE — Telephone Encounter (Signed)
Prescription Request  09/10/2022  LOV: 03/24/2022  What is the name of the medication or equipment?  aspirin EC (ASPIRIN ADULT LOW STRENGTH) 81 MG tablet [[id number]   potassium chloride SA (KLOR-CON M) 20 MEQ tablet [[id number]]   Have you contacted your pharmacy to request a refill? Yes   Which pharmacy would you like this sent to?  [facility] - [place], [place] - [address]  [place] [address] Phone: [phone] Fax: [phone]    Patient notified that their request is being sent to the clinical staff for review and that they should receive a response within 2 business days.   Please advise at [facility] [phone]

## 2022-09-13 ENCOUNTER — Telehealth: Payer: Self-pay

## 2022-09-13 ENCOUNTER — Ambulatory Visit (INDEPENDENT_AMBULATORY_CARE_PROVIDER_SITE_OTHER): Payer: 59 | Admitting: Physician Assistant

## 2022-09-13 ENCOUNTER — Encounter: Payer: Self-pay | Admitting: Physician Assistant

## 2022-09-13 ENCOUNTER — Other Ambulatory Visit
Admission: RE | Admit: 2022-09-13 | Discharge: 2022-09-13 | Disposition: A | Discharge status: Home / Self Care | Payer: 59 | Source: Ambulatory Visit | Attending: Physician Assistant | Admitting: Physician Assistant

## 2022-09-13 VITALS — BP 118/64 | HR 74 | Ht 68.0 in | Wt 171.0 lb

## 2022-09-13 DIAGNOSIS — I2510 Atherosclerotic heart disease of native coronary artery without angina pectoris: Secondary | ICD-10-CM

## 2022-09-13 DIAGNOSIS — E782 Mixed hyperlipidemia: Secondary | ICD-10-CM | POA: Diagnosis present

## 2022-09-13 DIAGNOSIS — I10 Essential (primary) hypertension: Secondary | ICD-10-CM | POA: Diagnosis not present

## 2022-09-13 DIAGNOSIS — I5042 Chronic combined systolic (congestive) and diastolic (congestive) heart failure: Secondary | ICD-10-CM | POA: Diagnosis not present

## 2022-09-13 DIAGNOSIS — I428 Other cardiomyopathies: Secondary | ICD-10-CM

## 2022-09-13 DIAGNOSIS — F1020 Alcohol dependence, uncomplicated: Secondary | ICD-10-CM

## 2022-09-13 LAB — CBC
HCT: 36.2 % — ABNORMAL LOW (ref 39.0–52.0)
Hemoglobin: 11.7 g/dL — ABNORMAL LOW (ref 13.0–17.0)
MCH: 28.1 pg (ref 26.0–34.0)
MCHC: 32.3 g/dL (ref 30.0–36.0)
MCV: 87 fL (ref 80.0–100.0)
Platelets: 231 10*3/uL (ref 150–400)
RBC: 4.16 MIL/uL — ABNORMAL LOW (ref 4.22–5.81)
RDW: 15.7 % — ABNORMAL HIGH (ref 11.5–15.5)
WBC: 4.6 10*3/uL (ref 4.0–10.5)
nRBC: 0 % (ref 0.0–0.2)

## 2022-09-13 LAB — COMPREHENSIVE METABOLIC PANEL
ALT: 19 U/L (ref 0–44)
AST: 23 U/L (ref 15–41)
Albumin: 4.4 g/dL (ref 3.5–5.0)
Alkaline Phosphatase: 91 U/L (ref 38–126)
Anion gap: 10 (ref 5–15)
BUN: 14 mg/dL (ref 8–23)
CO2: 24 mmol/L (ref 22–32)
Calcium: 9.7 mg/dL (ref 8.9–10.3)
Chloride: 97 mmol/L — ABNORMAL LOW (ref 98–111)
Creatinine, Ser: 0.9 mg/dL (ref 0.61–1.24)
GFR, Estimated: >60 mL/min (ref >60)
Glucose, Bld: 127 mg/dL — ABNORMAL HIGH (ref 70–99)
Potassium: 3.5 mmol/L (ref 3.5–5.1)
Sodium: 131 mmol/L — ABNORMAL LOW (ref 135–145)
Total Bilirubin: 0.7 mg/dL (ref 0.3–1.2)
Total Protein: 8 g/dL (ref 6.5–8.1)

## 2022-09-13 LAB — LIPID PANEL
Cholesterol: 118 mg/dL (ref 0–200)
HDL: 22 mg/dL — ABNORMAL LOW (ref >40)
LDL Cholesterol: UNDETERMINED mg/dL (ref 0–99)
Total CHOL/HDL Ratio: 5.4 RATIO
Triglycerides: 543 mg/dL — ABNORMAL HIGH (ref <150)
VLDL: UNDETERMINED mg/dL (ref 0–40)

## 2022-09-13 LAB — LDL CHOLESTEROL, DIRECT: Direct LDL: 27 mg/dL (ref 0–99)

## 2022-09-13 MED ORDER — ASPIRIN 81 MG PO TBEC: 81.0000 mg | DELAYED_RELEASE_TABLET | Freq: Every day | ORAL | 3 refills | Status: DC

## 2022-09-13 MED ORDER — POTASSIUM CHLORIDE CRYS ER 20 MEQ PO TBCR: 20.0000 meq | EXTENDED_RELEASE_TABLET | Freq: Every day | ORAL | 3 refills | Status: DC

## 2022-09-13 NOTE — Telephone Encounter (Signed)
Spoke to patients case manager who verbalized understanding. All questions (if any) were answered. Results viewable in MyChart for PCP.

## 2022-09-13 NOTE — Telephone Encounter (Signed)
-----   Message from Dyann Kief, PA-C sent at 09/13/2022 12:41 PM EDT ----- Triglycerides down 543 on vascepa but still high and glucose high. We discussed cutting back on sugars/sweets today. Anemia similar to before. No changes thanks

## 2022-09-13 NOTE — Patient Instructions (Signed)
Medication Instructions:  Your physician recommends that you continue on your current medications as directed. Please refer to the Current Medication list given to you today.   Labwork: Fasting Lipids,CMET,CBC  Testing/Procedures: None today  Follow-Up: 1 year  Any Other Special Instructions Will Be Listed Below (If Applicable).   You have been referred to Nutrition, they will call you to make an appointment.   Please exercise 20-30 minutes a day   Limit alcohol use.    If you need a refill on your cardiac medications before your next appointment, please call your pharmacy.

## 2022-09-29 ENCOUNTER — Ambulatory Visit (INDEPENDENT_AMBULATORY_CARE_PROVIDER_SITE_OTHER): Payer: 59 | Admitting: Internal Medicine

## 2022-09-29 ENCOUNTER — Encounter: Payer: Self-pay | Admitting: Gastroenterology

## 2022-09-29 ENCOUNTER — Encounter: Payer: Self-pay | Admitting: Internal Medicine

## 2022-09-29 VITALS — BP 114/55 | HR 80 | Ht 68.0 in | Wt 171.0 lb

## 2022-09-29 DIAGNOSIS — I5022 Chronic systolic (congestive) heart failure: Secondary | ICD-10-CM | POA: Diagnosis not present

## 2022-09-29 DIAGNOSIS — K219 Gastro-esophageal reflux disease without esophagitis: Secondary | ICD-10-CM | POA: Diagnosis not present

## 2022-09-29 DIAGNOSIS — F1020 Alcohol dependence, uncomplicated: Secondary | ICD-10-CM | POA: Diagnosis not present

## 2022-09-29 DIAGNOSIS — E782 Mixed hyperlipidemia: Secondary | ICD-10-CM

## 2022-09-29 DIAGNOSIS — R7303 Prediabetes: Secondary | ICD-10-CM

## 2022-09-29 DIAGNOSIS — D649 Anemia, unspecified: Secondary | ICD-10-CM | POA: Diagnosis not present

## 2022-09-29 DIAGNOSIS — Z122 Encounter for screening for malignant neoplasm of respiratory organs: Secondary | ICD-10-CM

## 2022-09-29 LAB — POCT GLYCOSYLATED HEMOGLOBIN (HGB A1C): HbA1c, POC (controlled diabetic range): 6.2 % (ref 0.0–7.0)

## 2022-09-29 MED ORDER — ASPIRIN 81 MG PO TBEC: 81.0000 mg | DELAYED_RELEASE_TABLET | Freq: Every day | ORAL | 3 refills | Status: DC

## 2022-09-29 NOTE — Assessment & Plan Note (Signed)
Lab Results  Component Value Date   HGBA1C 6.2 09/29/2022   Advised to follow low carb diet for now Has cut down cake and ice cream recently

## 2022-09-29 NOTE — Assessment & Plan Note (Signed)
On statin and Vascepa Checked lipid profile
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
## 2022-09-29 NOTE — Assessment & Plan Note (Signed)
On pantoprazole currently ?

## 2022-09-29 NOTE — Assessment & Plan Note (Signed)
In remission ?His NICM could be due to alcohol abuse in the past ?

## 2022-09-29 NOTE — Patient Instructions (Signed)
Please continue to take medications as prescribed.  Please continue to follow low carb diet and perform moderate exercise/walking as tolerated. 

## 2022-09-29 NOTE — Assessment & Plan Note (Signed)
-   echo 11/2014 showed LVEF 10-15%, diffuse hypokinesis, restrictive diastolic fyunction ?- cath 01/2015 without significant CAD. RHC with CI 2, mean PA 31, PCWP 22. ? ?- 09/2015 echo: LVEF 40%, grade I diasotlic dysfunction ??09/2017 echo: LVEF 50-55% ? ?On Entresto, Coreg and HCTZ ?BP low normal ?Appears euvolemic currently ?Followed by cardiology ? ?

## 2022-09-29 NOTE — Assessment & Plan Note (Signed)
Likely due to history of alcohol dependence Denies any active signs of bleeding currently Hb stable around 11 Last CBC reviewed

## 2022-09-29 NOTE — Assessment & Plan Note (Signed)
Quit smoking in 2012, has > 20-pack-year smoking history Ordered low-dose CT chest after discussing with the patient.

## 2022-10-04 ENCOUNTER — Other Ambulatory Visit: Payer: Self-pay | Admitting: Cardiology

## 2022-10-12 ENCOUNTER — Encounter: Payer: 59 | Attending: Internal Medicine | Admitting: Nutrition

## 2022-10-12 VITALS — Ht 68.0 in | Wt 162.0 lb

## 2022-10-12 DIAGNOSIS — I5022 Chronic systolic (congestive) heart failure: Secondary | ICD-10-CM | POA: Insufficient documentation

## 2022-10-12 NOTE — Progress Notes (Signed)
Medical Nutrition Therapy  Appointment Start time:  [id number]  Appointment End time:  50  Primary concerns today: CHF, HTN Referral diagnosis: E78.2, I25.10 Preferred learning style: no preference  Learning readiness: Ready   NUTRITION ASSESSMENT  72 yr old bmale here with his social worker to be seen for CHF and CAD. "I threw away a lot of sweets, cookies and junk food recently. I got an air fryer and have been using that."  PMH: CHF. Non ischemic cardiomyopathy, Fatty Liver, GERD, Osteoarthritis, pre dm , vit d deficiency. See chart.  He has already been making some changes with getting rid of unhealthy foods. He has been buying  healthier foods as he can afford them. Got an airfyer and using that to cook foods in. Feeling better.  Struggles to walk due to knee issues. Uses a cane. Has osteoarthritis. Lives by himself.  He is willing to work on eating more whole plant based foods with lifestyle medicine and getting rid of processed foods to improve his health.  Anthropometrics  Wt Readings from Last 3 Encounters:  10/12/22 162 lb (73.5 kg)  09/29/22 171 lb (77.6 kg)  09/13/22 171 lb (77.6 kg)   Ht Readings from Last 3 Encounters:  10/12/22 5\' 8"  (1.727 m)  09/29/22 5\' 8"  (1.727 m)  09/13/22 5\' 8"  (1.727 m)   Body mass index is 24.63 kg/m. @BMIFA @ Facility age limit for growth %iles is 20 years. Facility age limit for growth %iles is 20 years..   Clinical Medical Hx: See chart Medications: see chart Labs:  Lab Results  Component Value Date   HGBA1C 6.2 09/29/2022      Latest Ref Rng & Units 09/13/2022   11:42 AM 03/24/2022    1:58 PM 09/17/2021    9:43 AM  CMP  Glucose 70 - 99 mg/dL 540  981  191   BUN 8 - 23 mg/dL 14  11  27    Creatinine 0.61 - 1.24 mg/dL 4.78  2.95  6.21   Sodium 135 - 145 mmol/L 131  133  134   Potassium 3.5 - 5.1 mmol/L 3.5  3.9  4.2   Chloride 98 - 111 mmol/L 97  94  94   CO2 22 - 32 mmol/L 24  20  19    Calcium 8.9 - 10.3 mg/dL 9.7  9.9  9.8    Total Protein 6.5 - 8.1 g/dL 8.0  7.5  7.3   Total Bilirubin 0.3 - 1.2 mg/dL 0.7  0.3  0.3   Alkaline Phos 38 - 126 U/L 91  138  127   AST 15 - 41 U/L 23  14  27    ALT 0 - 44 U/L 19  10  18     Lipid Panel     Component Value Date/Time   CHOL 118 09/13/2022 1142   CHOL 128 03/24/2022 1358   TRIG 543 (H) 09/13/2022 1142   HDL 22 (L) 09/13/2022 1142   HDL 21 (L) 03/24/2022 1358   CHOLHDL 5.4 09/13/2022 1142   VLDL UNABLE TO CALCULATE IF TRIGLYCERIDE OVER 400 mg/dL 30/86/5784 6962   LDLCALC UNABLE TO CALCULATE IF TRIGLYCERIDE OVER 400 mg/dL 95/28/4132 4401   LDLCALC Comment (A) 03/24/2022 1358   LDLDIRECT 27 09/13/2022 1142   LABVLDL Comment (A) 03/24/2022 1358      Notable Signs/Symptoms: fatigue, no energy,   Lifestyle & Dietary Hx LIves by himself. Cooks at home. Walks with a cane.  Estimated daily fluid intake: 30 oz Supplements:  Sleep: varies  Stress / self-care: his health, food Current average weekly physical activity: ADL  24-Hr Dietary Recall Eating 2 meals per day; usally lunch and dinner. Drinking more water now. He gets food stamps and food card from insurance.  Estimated Energy Needs Calories: 1800 Carbohydrate: 200g Protein: 135g Fat: 50g   NUTRITION DIAGNOSIS  NI-5.6.3 Inappropriate intake of fats (specify): saturated fats and sodium intake,  As related to CHF and HTN.  As evidenced by Diet history and elevated blood pressure.  NUTRITION INTERVENTION  Nutrition education (E-1) on the following topics:  Nutrition and Diabetes education provided on My Plate, CHO counting, meal planning, portion sizes, timing of meals, avoiding snacks between meals unless having a low blood sugar, target ranges for A1C and blood sugars, signs/symptoms and treatment of hyper/hypoglycemia, monitoring blood sugars, taking medications as prescribed, benefits of exercising 30 minutes per day and prevention of complications of DM. Lifestyle Medicine  - Whole Food, Plant  Predominant Nutrition is highly recommended: Eat Plenty of vegetables, Mushrooms, fruits, Legumes, Whole Grains, Nuts, seeds in lieu of processed meats, processed snacks/pastries red meat, poultry, eggs.    -It is better to avoid simple carbohydrates including: Cakes, Sweet Desserts, Ice Cream, Soda (diet and regular), Sweet Tea, Candies, Chips, Cookies, Store Bought Juices, Alcohol in Excess of  1-2 drinks a day, Lemonade,  Artificial Sweeteners, Doughnuts, Coffee Creamers, "Sugar-free" Products, etc, etc.  This is not a complete list.....  Exercise: If you are able: 30 -60 minutes a day ,4 days a week, or 150 minutes a week.  The longer the better.  Combine stretch, strength, and aerobic activities.  If you were told in the past that you have high risk for cardiovascular diseases, you may seek evaluation by your heart doctor prior to initiating moderate to intense exercise programs.  Low Salt diet.  Handouts Provided Include  Lifestyle Medicine Know your numbers       Low Salt diet Learning Style & Readiness for Change Teaching method utilized: Visual & Auditory  Demonstrated degree of understanding via: Teach Back  Barriers to learning/adherence to lifestyle change: none  Goals Established by Pt Eat thee meals per day at times discussed Drink only water Increase eating foods from garden -fruits, vegetables and whole grains and not processed foods Use Mrs Sharilyn Sites for seasoning Don't use salt or salt substitutes. Avoid canned foods and get fresh for frozen vegetables.    MONITORING & EVALUATION Dietary intake, weekly physical activity, and wt and BP in 1-3 months.  Next Steps  Patient is to work on eating more foods from garden and avoid processed foods.Marland Kitchen

## 2022-11-02 ENCOUNTER — Ambulatory Visit: Payer: Medicare Other

## 2022-11-10 ENCOUNTER — Ambulatory Visit (INDEPENDENT_AMBULATORY_CARE_PROVIDER_SITE_OTHER): Payer: 59

## 2022-11-10 VITALS — Ht 68.0 in | Wt 163.0 lb

## 2022-11-10 DIAGNOSIS — Z0000 Encounter for general adult medical examination without abnormal findings: Secondary | ICD-10-CM

## 2022-11-10 DIAGNOSIS — Z0100 Encounter for examination of eyes and vision without abnormal findings: Secondary | ICD-10-CM

## 2022-11-10 DIAGNOSIS — Z87891 Personal history of nicotine dependence: Secondary | ICD-10-CM

## 2022-11-10 NOTE — Patient Instructions (Signed)
[name] , Thank you for taking time to come for your Medicare Wellness Visit. I appreciate your ongoing commitment to your health goals. Please review the following plan we discussed and let me know if I can assist you in the future.   These are the goals we discussed:  Goals      Patient Stated     Get 2nd shingles vaccine      Patient Stated     Patient states his goal is to improve his health and increase his water intake      Prevent falls        This is a list of the screening recommended for you and due dates:  Health Maintenance  Topic Date Due   Screening for Lung Cancer  Never done   Zoster (Shingles) Vaccine (1 of 2) Never done   COVID-19 Vaccine (5 - 2023-24 season) 02/05/2022   Flu Shot  01/06/2023   Medicare Annual Wellness Visit  11/10/2023   DTaP/Tdap/Td vaccine (2 - Td or Tdap) 01/29/2028   Colon Cancer Screening  04/13/2028   Pneumonia Vaccine  Completed   Hepatitis C Screening  Completed   HPV Vaccine  Aged Out    Advanced directives: Advance directive discussed with you today. Even though you declined this today, please call our office should you change your mind, and we can give you the proper paperwork for you to fill out. Advance care planning is a way to make decisions about medical care that fits your values in case you are ever unable to make these decisions for yourself.  Information on Advanced Care Planning can be found at [facility] Advance Health Care Directives Advance Health Care Directives ([email or web address]    Conditions/risks identified: The patient is asked to make an attempt to improve diet and exercise patterns to aid in medical management of this problem.   Next appointment: Follow up in one year for your annual wellness visit. November 23, 2023 AT 8:30AM TELEPHONE VISIT  Preventive Care 72 Years and Older, Male  Preventive care refers to lifestyle choices and visits with your health care provider that can promote  health and wellness. What does preventive care include? A yearly physical exam. This is also called an annual well check. Dental exams once or twice a year. Routine eye exams. Ask your health care provider how often you should have your eyes checked. Personal lifestyle choices, including: Daily care of your teeth and gums. Regular physical activity. Eating a healthy diet. Avoiding tobacco and drug use. Limiting alcohol use. Practicing safe sex. Taking low doses of aspirin every day. Taking vitamin and mineral supplements as recommended by your health care provider. What happens during an annual well check? The services and screenings done by your health care provider during your annual well check will depend on your age, overall health, lifestyle risk factors, and family history of disease. Counseling  Your health care provider may ask you questions about your: Alcohol use. Tobacco use. Drug use. Emotional well-being. Home and relationship well-being. Sexual activity. Eating habits. History of falls. Memory and ability to understand (cognition). Work and work Astronomer. Screening  You may have the following tests or measurements: Height, weight, and BMI. Blood pressure. Lipid and cholesterol levels. These may be checked every 5 years, or more frequently if you are over 68 years old. Skin check. Lung cancer screening. You may have this screening every year starting at age 71 if you have a 30-pack-year history of  smoking and currently smoke or have quit within the past 15 years. Fecal occult blood test (FOBT) of the stool. You may have this test every year starting at age 40. Flexible sigmoidoscopy or colonoscopy. You may have a sigmoidoscopy every 5 years or a colonoscopy every 10 years starting at age 5. Prostate cancer screening. Recommendations will vary depending on your family history and other risks. Hepatitis C blood test. Hepatitis B blood test. Sexually transmitted  disease (STD) testing. Diabetes screening. This is done by checking your blood sugar (glucose) after you have not eaten for a while (fasting). You may have this done every 1-3 years. Abdominal aortic aneurysm (AAA) screening. You may need this if you are a current or former smoker. Osteoporosis. You may be screened starting at age 16 if you are at high risk. Talk with your health care provider about your test results, treatment options, and if necessary, the need for more tests. Vaccines  Your health care provider may recommend certain vaccines, such as: Influenza vaccine. This is recommended every year. Tetanus, diphtheria, and acellular pertussis (Tdap, Td) vaccine. You may need a Td booster every 10 years. Zoster vaccine. You may need this after age 110. Pneumococcal 13-valent conjugate (PCV13) vaccine. One dose is recommended after age 82. Pneumococcal polysaccharide (PPSV23) vaccine. One dose is recommended after age 81. Talk to your health care provider about which screenings and vaccines you need and how often you need them. This information is not intended to replace advice given to you by your health care provider. Make sure you discuss any questions you have with your health care provider. Document Released: 06/20/2015 Document Revised: 02/11/2016 Document Reviewed: 03/25/2015 Elsevier Interactive Patient Education  2017 [employer].  Fall Prevention in the Home Falls can cause injuries. They can happen to people of all ages. There are many things you can do to make your home safe and to help prevent falls. What can I do on the outside of my home? Regularly fix the edges of walkways and driveways and fix any cracks. Remove anything that might make you trip as you walk through a door, such as a raised step or threshold. Trim any bushes or trees on the path to your home. Use bright outdoor lighting. Clear any walking paths of anything that might make someone trip, such as rocks or  tools. Regularly check to see if handrails are loose or broken. Make sure that both sides of any steps have handrails. Any raised decks and porches should have guardrails on the edges. Have any leaves, snow, or ice cleared regularly. Use sand or salt on walking paths during winter. Clean up any spills in your garage right away. This includes oil or grease spills. What can I do in the bathroom? Use night lights. Install grab bars by the toilet and in the tub and shower. Do not use towel bars as grab bars. Use non-skid mats or decals in the tub or shower. If you need to sit down in the shower, use a plastic, non-slip stool. Keep the floor dry. Clean up any water that spills on the floor as soon as it happens. Remove soap buildup in the tub or shower regularly. Attach bath mats securely with double-sided non-slip rug tape. Do not have throw rugs and other things on the floor that can make you trip. What can I do in the bedroom? Use night lights. Make sure that you have a light by your bed that is easy to reach. Do not use  any sheets or blankets that are too big for your bed. They should not hang down onto the floor. Have a firm chair that has side arms. You can use this for support while you get dressed. Do not have throw rugs and other things on the floor that can make you trip. What can I do in the kitchen? Clean up any spills right away. Avoid walking on wet floors. Keep items that you use a lot in easy-to-reach places. If you need to reach something above you, use a strong step stool that has a grab bar. Keep electrical cords out of the way. Do not use floor polish or wax that makes floors slippery. If you must use wax, use non-skid floor wax. Do not have throw rugs and other things on the floor that can make you trip. What can I do with my stairs? Do not leave any items on the stairs. Make sure that there are handrails on both sides of the stairs and use them. Fix handrails that are  broken or loose. Make sure that handrails are as long as the stairways. Check any carpeting to make sure that it is firmly attached to the stairs. Fix any carpet that is loose or worn. Avoid having throw rugs at the top or bottom of the stairs. If you do have throw rugs, attach them to the floor with carpet tape. Make sure that you have a light switch at the top of the stairs and the bottom of the stairs. If you do not have them, ask someone to add them for you. What else can I do to help prevent falls? Wear shoes that: Do not have high heels. Have rubber bottoms. Are comfortable and fit you well. Are closed at the toe. Do not wear sandals. If you use a stepladder: Make sure that it is fully opened. Do not climb a closed stepladder. Make sure that both sides of the stepladder are locked into place. Ask someone to hold it for you, if possible. Clearly mark and make sure that you can see: Any grab bars or handrails. First and last steps. Where the edge of each step is. Use tools that help you move around (mobility aids) if they are needed. These include: Canes. Walkers. Scooters. Crutches. Turn on the lights when you go into a dark area. Replace any light bulbs as soon as they burn out. Set up your furniture so you have a clear path. Avoid moving your furniture around. If any of your floors are uneven, fix them. If there are any pets around you, be aware of where they are. Review your medicines with your doctor. Some medicines can make you feel dizzy. This can increase your chance of falling. Ask your doctor what other things that you can do to help prevent falls. This information is not intended to replace advice given to you by your health care provider. Make sure you discuss any questions you have with your health care provider. Document Released: 03/20/2009 Document Revised: 10/30/2015 Document Reviewed: 06/28/2014 Elsevier Interactive Patient Education  2017 [employer].
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
## 2022-12-28 ENCOUNTER — Other Ambulatory Visit: Payer: Self-pay | Admitting: *Deleted

## 2022-12-28 ENCOUNTER — Encounter: Payer: 59 | Attending: Internal Medicine | Admitting: Nutrition

## 2022-12-28 DIAGNOSIS — Z122 Encounter for screening for malignant neoplasm of respiratory organs: Secondary | ICD-10-CM

## 2022-12-28 DIAGNOSIS — Z87891 Personal history of nicotine dependence: Secondary | ICD-10-CM

## 2022-12-28 DIAGNOSIS — I5022 Chronic systolic (congestive) heart failure: Secondary | ICD-10-CM | POA: Insufficient documentation

## 2022-12-28 NOTE — Progress Notes (Signed)
Medical Nutrition Therapy  Appointment Start time:  1030  Appointment End time:  1100  Primary concerns today: CHF, HTN Referral diagnosis: E78.2, I25.10 Preferred learning style: no preference  Learning readiness: Ready   NUTRITION ASSESSMENT Follow up CHF and HTN Social worker has helped with shopping using more herbs and spices. Breathing is much better. Sleeping much better. Drinking water now. Feels much better. Using Mrs Sharilyn Sites. PCP Dr. Aggie Hacker Lost 8 lbs of fluid from getting rid of all salty and processed foods. A1C 6.2% 4/24  Anthropometrics  Wt Readings from Last 3 Encounters:  11/10/22 163 lb (73.9 kg)  10/12/22 162 lb (73.5 kg)  09/29/22 171 lb (77.6 kg)   Ht Readings from Last 3 Encounters:  11/10/22 5\' 8"  (1.727 m)  10/12/22 5\' 8"  (1.727 m)  09/29/22 5\' 8"  (1.727 m)   There is no height or weight on file to calculate BMI. @BMIFA @ Facility age limit for growth %iles is 20 years. Facility age limit for growth %iles is 20 years..   Clinical Medical Hx: See chart Medications: see chart Labs:  Lab Results  Component Value Date   HGBA1C 6.2 09/29/2022      Latest Ref Rng & Units 09/13/2022   11:42 AM 03/24/2022    1:58 PM 09/17/2021    9:43 AM  CMP  Glucose 70 - 99 mg/dL 914  782  956   BUN 8 - 23 mg/dL 14  11  27    Creatinine 0.61 - 1.24 mg/dL 2.13  0.86  5.78   Sodium 135 - 145 mmol/L 131  133  134   Potassium 3.5 - 5.1 mmol/L 3.5  3.9  4.2   Chloride 98 - 111 mmol/L 97  94  94   CO2 22 - 32 mmol/L 24  20  19    Calcium 8.9 - 10.3 mg/dL 9.7  9.9  9.8   Total Protein 6.5 - 8.1 g/dL 8.0  7.5  7.3   Total Bilirubin 0.3 - 1.2 mg/dL 0.7  0.3  0.3   Alkaline Phos 38 - 126 U/L 91  138  127   AST 15 - 41 U/L 23  14  27    ALT 0 - 44 U/L 19  10  18     Lipid Panel     Component Value Date/Time   CHOL 118 09/13/2022 1142   CHOL 128 03/24/2022 1358   TRIG 543 (H) 09/13/2022 1142   HDL 22 (L) 09/13/2022 1142   HDL 21 (L) 03/24/2022 1358   CHOLHDL 5.4  09/13/2022 1142   VLDL UNABLE TO CALCULATE IF TRIGLYCERIDE OVER 400 mg/dL 46/96/2952 8413   LDLCALC UNABLE TO CALCULATE IF TRIGLYCERIDE OVER 400 mg/dL 24/40/1027 2536   LDLCALC Comment (A) 03/24/2022 1358   LDLDIRECT 27 09/13/2022 1142   LABVLDL Comment (A) 03/24/2022 1358      Notable Signs/Symptoms: fatigue, no energy,   Lifestyle & Dietary Hx LIves by himself. Cooks at home. Walks with a cane.  Estimated daily fluid intake: 30 oz Supplements:  Sleep: varies Stress / self-care: his health, food Current average weekly physical activity: ADL  24-Hr Dietary Recall B)eggs, [place] bacon, canteloupe, L) [place] bologna and white bread,   Estimated Energy Needs Calories: 1800 Carbohydrate: 200g Protein: 135g Fat: 50g   NUTRITION DIAGNOSIS  NI-5.6.3 Inappropriate intake of fats (specify): saturated fats, salty foods and high calorie foods,  As related to high fat diet of processed foods.  As evidenced by CHF and HTN..   NUTRITION INTERVENTION  Nutrition education (E-1) on the following topics:  Low Salt Low Fat diet Lifestyle Medicine  - Whole Food, Plant Predominant Nutrition is highly recommended: Eat Plenty of vegetables, Mushrooms, fruits, Legumes, Whole Grains, Nuts, seeds in lieu of processed meats, processed snacks/pastries red meat, poultry, eggs.    -It is better to avoid simple carbohydrates including: Cakes, Sweet Desserts, Ice Cream, Soda (diet and regular), Sweet Tea, Candies, Chips, Cookies, Store Bought Juices, Alcohol in Excess of  1-2 drinks a day, Lemonade,  Artificial Sweeteners, Doughnuts, Coffee Creamers, "Sugar-free" Products, etc, etc.  This is not a complete list.....  Exercise: If you are able: 30 -60 minutes a day ,4 days a week, or 150 minutes a week.  The longer the better.  Combine stretch, strength, and aerobic activities.  If you were told in the past that you have high risk for cardiovascular diseases, you may seek evaluation by your heart doctor  prior to initiating moderate to intense exercise programs.   Handouts Provided Include  Lifestyle Medicine handouts Low Sodium handout  Learning Style & Readiness for Change Teaching method utilized: Visual & Auditory  Demonstrated degree of understanding via: Teach Back  Barriers to learning/adherence to lifestyle change: walks with cane- not a lot of exercise. Depends on caregiver to prepare most meals.  Goals Established by Pt Continue to increase whole plant base foods Avoid canned and processed foods Keep drinking water Walk in the house for exercise as tolerated. Use Mrs. Dash and other herbs and spices.    MONITORING & EVALUATION Dietary intake, weekly physical activity,  in 3-6 months.  Next Steps  Patient is to work on eating whole plant based foods.Marland Kitchen

## 2022-12-29 ENCOUNTER — Ambulatory Visit: Payer: 59 | Admitting: Gastroenterology

## 2023-01-03 ENCOUNTER — Ambulatory Visit (INDEPENDENT_AMBULATORY_CARE_PROVIDER_SITE_OTHER): Payer: 59 | Admitting: Gastroenterology

## 2023-01-03 ENCOUNTER — Encounter: Payer: Self-pay | Admitting: Gastroenterology

## 2023-01-03 ENCOUNTER — Telehealth: Payer: Self-pay | Admitting: Gastroenterology

## 2023-01-03 ENCOUNTER — Other Ambulatory Visit: Payer: Self-pay

## 2023-01-03 VITALS — BP 137/78 | HR 88 | Temp 98.2°F | Ht 68.0 in | Wt 159.2 lb

## 2023-01-03 DIAGNOSIS — K219 Gastro-esophageal reflux disease without esophagitis: Secondary | ICD-10-CM | POA: Diagnosis not present

## 2023-01-03 DIAGNOSIS — K760 Fatty (change of) liver, not elsewhere classified: Secondary | ICD-10-CM | POA: Diagnosis not present

## 2023-01-03 DIAGNOSIS — D649 Anemia, unspecified: Secondary | ICD-10-CM | POA: Diagnosis not present

## 2023-01-03 DIAGNOSIS — R7989 Other specified abnormal findings of blood chemistry: Secondary | ICD-10-CM

## 2023-01-03 DIAGNOSIS — Z86010 Personal history of colonic polyps: Secondary | ICD-10-CM | POA: Diagnosis not present

## 2023-01-03 NOTE — Telephone Encounter (Signed)
Tammy, after patient left today, I realized we need to update labs. Please arrange with his Child psychotherapist.   CBC, CMET, iron/tibc/ferritin, B12, folate, hep a total ab, hep b surface antibody.
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
## 2023-01-03 NOTE — Telephone Encounter (Signed)
[name] the social worker was made aware and verbalized understanding.

## 2023-01-03 NOTE — Patient Instructions (Signed)
We will be in touch closer to September to schedule your colonoscopy.  Return office visit in six months.

## 2023-01-03 NOTE — Telephone Encounter (Signed)
Labs were ordered and message was left for social worker to return my call.

## 2023-01-10 ENCOUNTER — Other Ambulatory Visit: Payer: Self-pay

## 2023-01-10 DIAGNOSIS — D649 Anemia, unspecified: Secondary | ICD-10-CM

## 2023-01-10 DIAGNOSIS — R7989 Other specified abnormal findings of blood chemistry: Secondary | ICD-10-CM

## 2023-01-13 ENCOUNTER — Telehealth: Payer: Self-pay | Admitting: Gastroenterology

## 2023-01-13 NOTE — Telephone Encounter (Signed)
Patient's social worker, [name], left a message to get the patient scheduled for a colonoscopy.  I saw Leslie's note about moving forward with getting him scheduled.  [phone] ext. [phone]

## 2023-01-13 NOTE — Telephone Encounter (Signed)
Correct Verlon Au said in Sept and will be contacted once we get that schedule

## 2023-01-18 NOTE — Telephone Encounter (Signed)
Spoke with Gabriel Hammond and aware Dr. Jena Gauss has already filled for sept and will be calling once we receive OCT schedule. She stated that would work better for her also.

## 2023-01-18 NOTE — Telephone Encounter (Signed)
LMOVM to call back for Gabriel Hammond to schedule TCS with Dr. Jena Gauss ASA 3

## 2023-01-31 ENCOUNTER — Other Ambulatory Visit: Payer: Self-pay

## 2023-01-31 DIAGNOSIS — D649 Anemia, unspecified: Secondary | ICD-10-CM

## 2023-01-31 DIAGNOSIS — R7989 Other specified abnormal findings of blood chemistry: Secondary | ICD-10-CM

## 2023-02-10 LAB — CBC WITH DIFFERENTIAL/PLATELET
Basophils Absolute: 0 10*3/uL (ref 0.0–0.2)
Basos: 1 %
EOS (ABSOLUTE): 0.3 10*3/uL (ref 0.0–0.4)
Eos: 9 %
Hematocrit: 33.5 % — ABNORMAL LOW (ref 37.5–51.0)
Hemoglobin: 10.8 g/dL — ABNORMAL LOW (ref 13.0–17.7)
Immature Grans (Abs): 0 10*3/uL (ref 0.0–0.1)
Immature Granulocytes: 0 %
Lymphocytes Absolute: 0.8 10*3/uL (ref 0.7–3.1)
Lymphs: 22 %
MCH: 28.6 pg (ref 26.6–33.0)
MCHC: 32.2 g/dL (ref 31.5–35.7)
MCV: 89 fL (ref 79–97)
Monocytes Absolute: 0.5 10*3/uL (ref 0.1–0.9)
Monocytes: 14 %
Neutrophils Absolute: 1.9 10*3/uL (ref 1.4–7.0)
Neutrophils: 54 %
Platelets: 251 10*3/uL (ref 150–450)
RBC: 3.77 x10E6/uL — ABNORMAL LOW (ref 4.14–5.80)
RDW: 14.8 % (ref 11.6–15.4)
WBC: 3.5 10*3/uL (ref 3.4–10.8)

## 2023-02-10 LAB — HEPATIC FUNCTION PANEL
ALT: 16 IU/L (ref 0–44)
AST: 21 IU/L (ref 0–40)
Albumin: 4.8 g/dL (ref 3.8–4.8)
Alkaline Phosphatase: 109 IU/L (ref 44–121)
Bilirubin Total: 0.6 mg/dL (ref 0.0–1.2)
Bilirubin, Direct: 0.21 mg/dL (ref 0.00–0.40)
Total Protein: 7.5 g/dL (ref 6.0–8.5)

## 2023-02-10 LAB — FERRITIN: Ferritin: 1247 ng/mL — ABNORMAL HIGH (ref 30–400)

## 2023-02-16 ENCOUNTER — Other Ambulatory Visit: Payer: Self-pay

## 2023-02-16 ENCOUNTER — Encounter: Payer: Self-pay | Admitting: Acute Care

## 2023-02-16 ENCOUNTER — Ambulatory Visit (INDEPENDENT_AMBULATORY_CARE_PROVIDER_SITE_OTHER): Payer: 59 | Admitting: Acute Care

## 2023-02-16 DIAGNOSIS — Z87891 Personal history of nicotine dependence: Secondary | ICD-10-CM

## 2023-02-16 DIAGNOSIS — D509 Iron deficiency anemia, unspecified: Secondary | ICD-10-CM

## 2023-02-16 DIAGNOSIS — R7989 Other specified abnormal findings of blood chemistry: Secondary | ICD-10-CM

## 2023-02-16 NOTE — Progress Notes (Signed)
Virtual Visit via Telephone Note  I connected with Gabriel Hammond on 02/16/23 at  9:30 AM EDT by telephone and verified that I am speaking with the correct person using two identifiers.  Location: Patient:  At home Provider: [address] [place], [place], [address]    I discussed the limitations, risks, security and privacy concerns of performing an evaluation and management service by telephone and the availability of in person appointments. I also discussed with the patient that there may be a patient responsible charge related to this service. The patient expressed understanding and agreed to proceed.   Shared Decision Making Visit Lung Cancer Screening Program [phone])   Eligibility: Age 72 y.o. Pack Years Smoking History Calculation 45 pack year smoking history (# packs/per year x # years smoked) Recent History of coughing up blood  no Unexplained weight loss? no ( >Than 15 pounds within the last 6 months ) Prior History Lung / other cancer no (Diagnosis within the last 5 years already requiring surveillance chest CT Scans). Smoking Status Former Smoker Former Smokers: Years since quit: 12 years  Quit Date: 06/07/2010  Visit Components: Discussion included one or more decision making aids. yes Discussion included risk/benefits of screening. yes Discussion included potential follow up diagnostic testing for abnormal scans. yes Discussion included meaning and risk of over diagnosis. yes Discussion included meaning and risk of False Positives. yes Discussion included meaning of total radiation exposure. yes  Counseling Included: Importance of adherence to annual lung cancer LDCT screening. yes Impact of comorbidities on ability to participate in the program. yes Ability and willingness to under diagnostic treatment. yes  Smoking Cessation Counseling: Current Smokers:  Discussed importance of smoking cessation. yes Information about tobacco cessation classes and  interventions provided to patient. yes Patient provided with "ticket" for LDCT Scan. yes Symptomatic Patient. no  Counseling NA Diagnosis Code: Tobacco Use Z72.0 Asymptomatic Patient yes  Counseling (Intermediate counseling: > three minutes counseling) J1914 Former Smokers:  Discussed the importance of maintaining cigarette abstinence. yes Diagnosis Code: Personal History of Nicotine Dependence. N82.956 Information about tobacco cessation classes and interventions provided to patient. Yes Patient provided with "ticket" for LDCT Scan. yes Written Order for Lung Cancer Screening with LDCT placed in Epic. Yes (CT Chest Lung Cancer Screening Low Dose W/O CM) [id number] Z12.2-Screening of respiratory organs  I spent 25 minutes of face to face time/virtual visit time  with  Gabriel Hammond and his social worker Gabriel Hammond discussing the risks and benefits of lung cancer screening. We took the time to pause the power point at intervals to allow for questions to be asked and answered to ensure understanding. We discussed that he had taken the single most powerful action possible to decrease his risk of developing lung cancer when he quit smoking. I counseled him to remain smoke free, and to contact me if he ever had the desire to smoke again so that I can provide resources and tools to help support the effort to remain smoke free. We discussed the time and location of the scan, and that either  Gabriel Miyamoto RN, Gabriel Lemon, RN or I  or I will call / send a letter with the results within  24-72 hours of receiving them. He, and Gabriel Hammond  have the office contact information in the event he needs to speak with me,  he verbalized understanding of all of the above and had no further questions upon leaving the office.     I explained to the patient  that there has been a high incidence of coronary artery disease noted on these exams. I explained that this is a non-gated exam therefore degree or severity cannot be  determined. This patient is not on statin therapy. I have asked the patient to follow-up with their PCP regarding any incidental finding of coronary artery disease and management with diet or medication as they feel is clinically indicated. The patient verbalized understanding of the above and had no further questions.     Gabriel Ngo, NP 02/16/2023

## 2023-02-16 NOTE — Patient Instructions (Signed)

## 2023-02-17 ENCOUNTER — Ambulatory Visit (HOSPITAL_COMMUNITY)
Admission: RE | Admit: 2023-02-17 | Discharge: 2023-02-17 | Disposition: A | Discharge status: Home / Self Care | Payer: 59 | Source: Ambulatory Visit | Attending: Acute Care | Admitting: Acute Care

## 2023-02-17 DIAGNOSIS — Z122 Encounter for screening for malignant neoplasm of respiratory organs: Secondary | ICD-10-CM | POA: Diagnosis present

## 2023-02-17 DIAGNOSIS — Z87891 Personal history of nicotine dependence: Secondary | ICD-10-CM | POA: Diagnosis present

## 2023-02-17 MED ORDER — PEG 3350-KCL-NA BICARB-NACL 420 G PO SOLR: 4000.0000 mL | Freq: Once | ORAL | 0 refills | Status: AC

## 2023-02-17 NOTE — Telephone Encounter (Signed)
Spoke with [employer]. Patient scheduled for 10/14. Aware will pt will need pre-op appt. Instructions and pre-op will be faxed to her at [phone]. Rx for prep [facility].

## 2023-02-17 NOTE — Addendum Note (Signed)
Addended by: Armstead Peaks on: 02/17/2023 03:04 PM   Modules accepted: Orders

## 2023-02-18 ENCOUNTER — Encounter: Payer: Self-pay | Admitting: *Deleted

## 2023-02-24 ENCOUNTER — Encounter: Payer: Self-pay | Admitting: Nutrition

## 2023-02-24 NOTE — Patient Instructions (Signed)
Continue to increase whole plant base foods Avoid canned and processed foods Keep drinking water Walk in the house for exercise as tolerated. Use Mrs. Dash and other herbs and spices.

## 2023-03-02 ENCOUNTER — Other Ambulatory Visit: Payer: Self-pay

## 2023-03-02 DIAGNOSIS — Z122 Encounter for screening for malignant neoplasm of respiratory organs: Secondary | ICD-10-CM

## 2023-03-02 DIAGNOSIS — Z87891 Personal history of nicotine dependence: Secondary | ICD-10-CM

## 2023-03-03 ENCOUNTER — Encounter: Payer: Self-pay | Admitting: Nutrition

## 2023-03-03 NOTE — Patient Instructions (Signed)
Goals Established by Pt Eat thee meals per day at times discussed Drink only water Increase eating foods from garden -fruits, vegetables and whole grains and not processed foods Use Mrs Sharilyn Sites for seasoning Don't use salt or salt substitutes. Avoid canned foods and get fresh for frozen vegetab

## 2023-03-10 ENCOUNTER — Other Ambulatory Visit: Payer: Self-pay | Admitting: *Deleted

## 2023-03-10 MED ORDER — BISACODYL 5 MG PO TBEC: DELAYED_RELEASE_TABLET | ORAL | 0 refills | Status: AC

## 2023-03-10 MED ORDER — FLEET ENEMA RE ENEM: ENEMA | RECTAL | 0 refills | Status: AC

## 2023-03-16 NOTE — Patient Instructions (Signed)
Gabriel Hammond  03/16/2023     @PREFPERIOPPHARMACY @   Your procedure is scheduled on  03/21/2023.   Report to [facility] at  1000  A.M.   Call this number if you have problems the morning of surgery:  [phone]  If you experience any cold or flu symptoms such as cough, fever, chills, shortness of breath, etc. between now and your scheduled surgery, please notify us at the above number.   Remember:   Follow the diet and prep instructions given to you by the office.    You may drink clear liquids until 0800 am on 03/21/2023.    Clear liquids allowed are:                    Water, Black Coffee Only (No creamer, milk or cream, including half & half and powdered creamer), and Clear Sports drink (No red color; diabetics please choose diet or no sugar options)     Take these medicines the morning of surgery with A SIP OF WATER                           carvedilol, entresto, pantoprazole.     Do not wear jewelry, make-up or nail polish, including gel polish,  artificial nails, or any other type of covering on natural nails (fingers and  toes).  Do not wear lotions, powders, or perfumes, or deodorant.  Do not shave 48 hours prior to surgery.  Men may shave face and neck.  Do not bring valuables to the hospital.  [facility] is not responsible for any belongings or valuables.  Contacts, dentures or bridgework may not be worn into surgery.  Leave your suitcase in the car.  After surgery it may be brought to your room.  For patients admitted to the hospital, discharge time will be determined by your treatment team.  Patients discharged the day of surgery will not be allowed to drive home and must have someone with them for 24 hours.    Special instructions:   DO NOT smoke tobacco or vape for 24 hours before your procedure.  Please read over the following fact sheets that you were given. Anesthesia Post-op Instructions and Care and Recovery After  Surgery      Colonoscopy, Adult, Care After The following information offers guidance on how to care for yourself after your procedure. Your health care provider may also give you more specific instructions. If you have problems or questions, contact your health care provider. What can I expect after the procedure? After the procedure, it is common to have: A small amount of blood in your stool for 24 hours after the procedure. Some gas. Mild cramping or bloating of your abdomen. Follow these instructions at home: Eating and drinking  Drink enough fluid to keep your urine pale yellow. Follow instructions from your health care provider about eating or drinking restrictions. Resume your normal diet as told by your health care provider. Avoid heavy or fried foods that are hard to digest. Activity Rest as told by your health care provider. Avoid sitting for a long time without moving. Get up to take short walks every 1-2 hours. This is important to improve blood flow and breathing. Ask for help if you feel weak or unsteady. Return to your normal activities as told by your health care provider. Ask your health care provider what activities are safe for  you. Managing cramping and bloating  Try walking around when you have cramps or feel bloated. If directed, apply heat to your abdomen as told by your health care provider. Use the heat source that your health care provider recommends, such as a moist heat pack or a heating pad. Place a towel between your skin and the heat source. Leave the heat on for 20-30 minutes. Remove the heat if your skin turns bright red. This is especially important if you are unable to feel pain, heat, or cold. You have a greater risk of getting burned. General instructions If you were given a sedative during the procedure, it can affect you for several hours. Do not drive or operate machinery until your health care provider says that it is safe. For the first 24  hours after the procedure: Do not sign important documents. Do not drink alcohol. Do your regular daily activities at a slower pace than normal. Eat soft foods that are easy to digest. Take over-the-counter and prescription medicines only as told by your health care provider. Keep all follow-up visits. This is important. Contact a health care provider if: You have blood in your stool 2-3 days after the procedure. Get help right away if: You have more than a small spotting of blood in your stool. You have large blood clots in your stool. You have swelling of your abdomen. You have nausea or vomiting. You have a fever. You have increasing pain in your abdomen that is not relieved with medicine. These symptoms may be an emergency. Get help right away. Call 911. Do not wait to see if the symptoms will go away. Do not drive yourself to the hospital. Summary After the procedure, it is common to have a small amount of blood in your stool. You may also have mild cramping and bloating of your abdomen. If you were given a sedative during the procedure, it can affect you for several hours. Do not drive or operate machinery until your health care provider says that it is safe. Get help right away if you have a lot of blood in your stool, nausea or vomiting, a fever, or increased pain in your abdomen. This information is not intended to replace advice given to you by your health care provider. Make sure you discuss any questions you have with your health care provider. Document Revised: 07/06/2022 Document Reviewed: 01/14/2021 Elsevier Patient Education  2024 [employer]. Monitored Anesthesia Care, Care After The following information offers guidance on how to care for yourself after your procedure. Your health care provider may also give you more specific instructions. If you have problems or questions, contact your health care provider. What can I expect after the procedure? After the procedure,  it is common to have: Tiredness. Little or no memory about what happened during or after the procedure. Impaired judgment when it comes to making decisions. Nausea or vomiting. Some trouble with balance. Follow these instructions at home: For the time period you were told by your health care provider:  Rest. Do not participate in activities where you could fall or become injured. Do not drive or use machinery. Do not drink alcohol. Do not take sleeping pills or medicines that cause drowsiness. Do not make important decisions or sign legal documents. Do not take care of children on your own. Medicines Take over-the-counter and prescription medicines only as told by your health care provider. If you were prescribed antibiotics, take them as told by your health care provider. Do not  stop using the antibiotic even if you start to feel better. Eating and drinking Follow instructions from your health care provider about what you may eat and drink. Drink enough fluid to keep your urine pale yellow. If you vomit: Drink clear fluids slowly and in small amounts as you are able. Clear fluids include water, ice chips, low-calorie sports drinks, and fruit juice that has water added to it (diluted fruit juice). Eat light and bland foods in small amounts as you are able. These foods include bananas, applesauce, rice, lean meats, toast, and crackers. General instructions  Have a responsible adult stay with you for the time you are told. It is important to have someone help care for you until you are awake and alert. If you have sleep apnea, surgery and some medicines can increase your risk for breathing problems. Follow instructions from your health care provider about wearing your sleep device: When you are sleeping. This includes during daytime naps. While taking prescription pain medicines, sleeping medicines, or medicines that make you drowsy. Do not use any products that contain nicotine or  tobacco. These products include cigarettes, chewing tobacco, and vaping devices, such as e-cigarettes. If you need help quitting, ask your health care provider. Contact a health care provider if: You feel nauseous or vomit every time you eat or drink. You feel light-headed. You are still sleepy or having trouble with balance after 24 hours. You get a rash. You have a fever. You have redness or swelling around the IV site. Get help right away if: You have trouble breathing. You have new confusion after you get home. These symptoms may be an emergency. Get help right away. Call 911. Do not wait to see if the symptoms will go away. Do not drive yourself to the hospital. This information is not intended to replace advice given to you by your health care provider. Make sure you discuss any questions you have with your health care provider. Document Revised: 10/19/2021 Document Reviewed: 10/19/2021 Elsevier Patient Education  2024 [employer].

## 2023-03-17 ENCOUNTER — Encounter (HOSPITAL_COMMUNITY)
Admission: RE | Admit: 2023-03-17 | Discharge: 2023-03-17 | Disposition: A | Discharge status: Home / Self Care | Payer: 59 | Source: Ambulatory Visit | Attending: Internal Medicine | Admitting: Internal Medicine

## 2023-03-17 DIAGNOSIS — F1020 Alcohol dependence, uncomplicated: Secondary | ICD-10-CM

## 2023-03-17 DIAGNOSIS — D649 Anemia, unspecified: Secondary | ICD-10-CM

## 2023-03-21 ENCOUNTER — Telehealth: Payer: Self-pay | Admitting: *Deleted

## 2023-03-21 ENCOUNTER — Encounter (HOSPITAL_COMMUNITY): Admission: RE | Payer: Self-pay | Source: Home / Self Care

## 2023-03-21 ENCOUNTER — Ambulatory Visit (HOSPITAL_COMMUNITY): Admission: RE | Admit: 2023-03-21 | Payer: 59 | Source: Home / Self Care | Admitting: Internal Medicine

## 2023-03-21 SURGERY — COLONOSCOPY WITH PROPOFOL
Anesthesia: Monitor Anesthesia Care

## 2023-03-21 NOTE — Telephone Encounter (Signed)
Per Wynelle Beckmann in endo "Called and cancelled colonoscopy scheduled for today, 10/14 Did not start prep as instructed".  Patient needs OV prior to rescheduling. Thanks!

## 2023-03-22 NOTE — Telephone Encounter (Signed)
Tried calling patient, no answer, no voicemail.

## 2023-03-28 ENCOUNTER — Other Ambulatory Visit: Payer: Self-pay

## 2023-03-28 DIAGNOSIS — Z122 Encounter for screening for malignant neoplasm of respiratory organs: Secondary | ICD-10-CM

## 2023-03-28 DIAGNOSIS — R7989 Other specified abnormal findings of blood chemistry: Secondary | ICD-10-CM

## 2023-03-28 DIAGNOSIS — Z87891 Personal history of nicotine dependence: Secondary | ICD-10-CM

## 2023-03-28 DIAGNOSIS — D509 Iron deficiency anemia, unspecified: Secondary | ICD-10-CM

## 2023-03-30 ENCOUNTER — Encounter: Payer: 59 | Admitting: Internal Medicine

## 2023-03-31 ENCOUNTER — Encounter: Payer: Self-pay | Admitting: Internal Medicine

## 2023-04-08 ENCOUNTER — Other Ambulatory Visit: Payer: Self-pay | Admitting: Cardiology

## 2023-04-08 ENCOUNTER — Other Ambulatory Visit: Payer: Self-pay | Admitting: Internal Medicine

## 2023-04-12 ENCOUNTER — Telehealth: Payer: Self-pay | Admitting: Internal Medicine

## 2023-04-14 LAB — CBC WITH DIFFERENTIAL/PLATELET
Basophils Absolute: 0.1 10*3/uL (ref 0.0–0.2)
Basos: 1 %
EOS (ABSOLUTE): 0.4 10*3/uL (ref 0.0–0.4)
Eos: 9 %
Hematocrit: 34.9 % — ABNORMAL LOW (ref 37.5–51.0)
Hemoglobin: 11.3 g/dL — ABNORMAL LOW (ref 13.0–17.7)
Immature Grans (Abs): 0 10*3/uL (ref 0.0–0.1)
Immature Granulocytes: 0 %
Lymphocytes Absolute: 0.9 10*3/uL (ref 0.7–3.1)
Lymphs: 17 %
MCH: 28.9 pg (ref 26.6–33.0)
MCHC: 32.4 g/dL (ref 31.5–35.7)
MCV: 89 fL (ref 79–97)
Monocytes Absolute: 0.6 10*3/uL (ref 0.1–0.9)
Monocytes: 11 %
Neutrophils Absolute: 3.1 10*3/uL (ref 1.4–7.0)
Neutrophils: 62 %
Platelets: 243 10*3/uL (ref 150–450)
RBC: 3.91 x10E6/uL — ABNORMAL LOW (ref 4.14–5.80)
RDW: 13.9 % (ref 11.6–15.4)
WBC: 5.1 10*3/uL (ref 3.4–10.8)

## 2023-04-14 LAB — HEPATIC FUNCTION PANEL
ALT: 16 [IU]/L (ref 0–44)
AST: 21 [IU]/L (ref 0–40)
Albumin: 4.7 g/dL (ref 3.8–4.8)
Alkaline Phosphatase: 105 [IU]/L (ref 44–121)
Bilirubin Total: 0.3 mg/dL (ref 0.0–1.2)
Bilirubin, Direct: 0.15 mg/dL (ref 0.00–0.40)
Total Protein: 7.4 g/dL (ref 6.0–8.5)

## 2023-04-14 LAB — FERRITIN: Ferritin: 1002 ng/mL — ABNORMAL HIGH (ref 30–400)

## 2023-04-14 LAB — SEDIMENTATION RATE: Sed Rate: 23 mm/h (ref 0–30)

## 2023-04-14 LAB — C-REACTIVE PROTEIN: CRP: 3 mg/L (ref 0–10)

## 2023-04-19 NOTE — Telephone Encounter (Signed)
error 

## 2023-04-20 ENCOUNTER — Encounter: Payer: Self-pay | Admitting: Internal Medicine

## 2023-04-20 ENCOUNTER — Ambulatory Visit (INDEPENDENT_AMBULATORY_CARE_PROVIDER_SITE_OTHER): Payer: 59 | Admitting: Internal Medicine

## 2023-04-20 VITALS — BP 105/58 | HR 88 | Ht 68.0 in | Wt 167.2 lb

## 2023-04-20 DIAGNOSIS — Z23 Encounter for immunization: Secondary | ICD-10-CM

## 2023-04-20 DIAGNOSIS — Z0001 Encounter for general adult medical examination with abnormal findings: Secondary | ICD-10-CM | POA: Diagnosis not present

## 2023-04-20 DIAGNOSIS — E782 Mixed hyperlipidemia: Secondary | ICD-10-CM

## 2023-04-20 DIAGNOSIS — D649 Anemia, unspecified: Secondary | ICD-10-CM

## 2023-04-20 DIAGNOSIS — I5022 Chronic systolic (congestive) heart failure: Secondary | ICD-10-CM | POA: Diagnosis not present

## 2023-04-20 DIAGNOSIS — K219 Gastro-esophageal reflux disease without esophagitis: Secondary | ICD-10-CM

## 2023-04-20 DIAGNOSIS — F1020 Alcohol dependence, uncomplicated: Secondary | ICD-10-CM | POA: Diagnosis not present

## 2023-04-20 DIAGNOSIS — R7303 Prediabetes: Secondary | ICD-10-CM

## 2023-04-20 MED ORDER — ATORVASTATIN CALCIUM 80 MG PO TABS
80.0000 mg | ORAL_TABLET | Freq: Every day | ORAL | 10 refills | Status: DC
Start: 2023-04-20 — End: 2024-04-23

## 2023-04-20 NOTE — Patient Instructions (Addendum)
Please continue to take medications as prescribed.  Please continue to follow low salt diet and perform moderate exercise/walking at least 150 mins/week.  Please consider getting Hepatitis A and B vaccines from health department or local pharmacy.

## 2023-04-20 NOTE — Assessment & Plan Note (Signed)
Lab Results  Component Value Date   HGBA1C 6.2 09/29/2022   Advised to follow low carb diet for now Has cut down cake and ice cream now

## 2023-04-20 NOTE — Assessment & Plan Note (Signed)
-   echo 11/2014 showed LVEF 10-15%, diffuse hypokinesis, restrictive diastolic fyunction ?- cath 01/2015 without significant CAD. RHC with CI 2, mean PA 31, PCWP 22. ? ?- 09/2015 echo: LVEF 40%, grade I diasotlic dysfunction ??09/2017 echo: LVEF 50-55% ? ?On Entresto, Coreg and HCTZ ?BP low normal ?Appears euvolemic currently ?Followed by cardiology ? ?

## 2023-04-20 NOTE — Assessment & Plan Note (Signed)
In remission ?His NICM could be due to alcohol abuse in the past ?

## 2023-04-20 NOTE — Assessment & Plan Note (Addendum)
Physical exam as documented. Recent blood tests reviewed today. Flu vaccine today. He is up-to-date with pneumococcal and Shingrix vaccines.

## 2023-04-20 NOTE — Assessment & Plan Note (Addendum)
Well controlled On pantoprazole currently

## 2023-04-20 NOTE — Assessment & Plan Note (Signed)
Likely due to history of alcohol dependence Denies any active signs of bleeding currently Hb stable around 11 Last CBC reviewed

## 2023-04-20 NOTE — Assessment & Plan Note (Signed)
On statin and Vascepa Checked lipid profile
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
## 2023-06-09 ENCOUNTER — Other Ambulatory Visit: Payer: Self-pay | Admitting: Cardiology

## 2023-06-09 ENCOUNTER — Other Ambulatory Visit: Payer: Self-pay | Admitting: Gastroenterology

## 2023-06-10 ENCOUNTER — Other Ambulatory Visit: Payer: Self-pay | Admitting: Cardiology

## 2023-06-27 ENCOUNTER — Encounter: Payer: Self-pay | Admitting: Internal Medicine

## 2023-07-18 ENCOUNTER — Other Ambulatory Visit: Payer: Self-pay | Admitting: Internal Medicine

## 2023-07-18 DIAGNOSIS — E782 Mixed hyperlipidemia: Secondary | ICD-10-CM

## 2023-07-18 MED ORDER — VASCEPA 1 G PO CAPS: 2.0000 g | ORAL_CAPSULE | Freq: Two times a day (BID) | ORAL | 10 refills | Status: DC

## 2023-08-30 ENCOUNTER — Other Ambulatory Visit: Payer: Self-pay | Admitting: Physician Assistant

## 2023-08-30 DIAGNOSIS — I5022 Chronic systolic (congestive) heart failure: Secondary | ICD-10-CM

## 2023-09-15 ENCOUNTER — Encounter: Payer: Self-pay | Admitting: Cardiology

## 2023-09-15 ENCOUNTER — Ambulatory Visit: Payer: 59 | Attending: Cardiology | Admitting: Cardiology

## 2023-09-15 VITALS — BP 136/78 | HR 79 | Ht 68.0 in | Wt 169.0 lb

## 2023-09-15 DIAGNOSIS — E782 Mixed hyperlipidemia: Secondary | ICD-10-CM | POA: Diagnosis not present

## 2023-09-15 DIAGNOSIS — I10 Essential (primary) hypertension: Secondary | ICD-10-CM

## 2023-09-15 DIAGNOSIS — I428 Other cardiomyopathies: Secondary | ICD-10-CM

## 2023-09-15 DIAGNOSIS — I2510 Atherosclerotic heart disease of native coronary artery without angina pectoris: Secondary | ICD-10-CM | POA: Diagnosis not present

## 2023-09-15 DIAGNOSIS — I5032 Chronic diastolic (congestive) heart failure: Secondary | ICD-10-CM | POA: Diagnosis not present
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
## 2023-09-15 NOTE — Patient Instructions (Signed)

## 2023-10-19 ENCOUNTER — Ambulatory Visit (INDEPENDENT_AMBULATORY_CARE_PROVIDER_SITE_OTHER): Payer: 59 | Admitting: Internal Medicine

## 2023-10-19 ENCOUNTER — Encounter: Payer: Self-pay | Admitting: Internal Medicine

## 2023-10-19 VITALS — BP 116/70 | HR 80 | Ht 68.0 in | Wt 169.6 lb

## 2023-10-19 DIAGNOSIS — R7303 Prediabetes: Secondary | ICD-10-CM | POA: Diagnosis not present

## 2023-10-19 DIAGNOSIS — K219 Gastro-esophageal reflux disease without esophagitis: Secondary | ICD-10-CM | POA: Diagnosis not present

## 2023-10-19 DIAGNOSIS — E782 Mixed hyperlipidemia: Secondary | ICD-10-CM

## 2023-10-19 DIAGNOSIS — I5022 Chronic systolic (congestive) heart failure: Secondary | ICD-10-CM | POA: Diagnosis not present

## 2023-10-19 DIAGNOSIS — F1020 Alcohol dependence, uncomplicated: Secondary | ICD-10-CM

## 2023-10-19 DIAGNOSIS — Z125 Encounter for screening for malignant neoplasm of prostate: Secondary | ICD-10-CM | POA: Diagnosis not present
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
## 2023-10-19 NOTE — Assessment & Plan Note (Signed)
In remission ?His NICM could be due to alcohol abuse in the past ?

## 2023-10-19 NOTE — Patient Instructions (Signed)
 Please continue to take medications as prescribed.  Please continue to follow low carb diet and perform moderate exercise/walking as tolerated.

## 2023-10-19 NOTE — Assessment & Plan Note (Addendum)
 Well controlled On pantoprazole 40 mg BID currently

## 2023-10-19 NOTE — Assessment & Plan Note (Addendum)
-   echo 11/2014 showed LVEF 10-15%, diffuse hypokinesis, restrictive diastolic fyunction - cath 01/2015 without significant CAD. RHC with CI 2, mean PA 31, PCWP 22.   - 09/2015 echo: LVEF 40%, grade I diasotlic dysfunction  09/2017 echo: LVEF 50-55%  On Entresto , Coreg  and HCTZ BP usually low normal Appears euvolemic currently Followed by cardiology

## 2023-10-19 NOTE — Assessment & Plan Note (Addendum)
 Ordered PSA after discussing its limitations for prostate cancer screening, including false positive results leading to additional investigations.

## 2023-10-19 NOTE — Assessment & Plan Note (Signed)
 Lab Results  Component Value Date   HGBA1C 6.2 09/29/2022   Advised to follow low carb diet for now Has cut down cake and ice cream now

## 2023-10-20 ENCOUNTER — Ambulatory Visit: Payer: Self-pay | Admitting: Internal Medicine

## 2023-10-20 ENCOUNTER — Other Ambulatory Visit: Payer: Self-pay | Admitting: Internal Medicine

## 2023-10-20 DIAGNOSIS — E119 Type 2 diabetes mellitus without complications: Secondary | ICD-10-CM | POA: Insufficient documentation

## 2023-10-20 DIAGNOSIS — E1169 Type 2 diabetes mellitus with other specified complication: Secondary | ICD-10-CM

## 2023-10-20 LAB — TSH: TSH: 0.925 u[IU]/mL (ref 0.450–4.500)

## 2023-10-20 LAB — PSA: Prostate Specific Ag, Serum: 2.5 ng/mL (ref 0.0–4.0)

## 2023-10-20 LAB — CMP14+EGFR
ALT: 16 IU/L (ref 0–44)
AST: 14 IU/L (ref 0–40)
Albumin: 4.5 g/dL (ref 3.8–4.8)
Alkaline Phosphatase: 97 IU/L (ref 44–121)
BUN/Creatinine Ratio: 20 (ref 10–24)
BUN: 18 mg/dL (ref 8–27)
Bilirubin Total: 0.3 mg/dL (ref 0.0–1.2)
CO2: 20 mmol/L (ref 20–29)
Calcium: 9.6 mg/dL (ref 8.6–10.2)
Chloride: 100 mmol/L (ref 96–106)
Creatinine, Ser: 0.91 mg/dL (ref 0.76–1.27)
Globulin, Total: 2.6 g/dL (ref 1.5–4.5)
Glucose: 128 mg/dL — ABNORMAL HIGH (ref 70–99)
Potassium: 4.1 mmol/L (ref 3.5–5.2)
Sodium: 138 mmol/L (ref 134–144)
Total Protein: 7.1 g/dL (ref 6.0–8.5)
eGFR: 90 mL/min/{1.73_m2} (ref >59)

## 2023-10-20 LAB — CBC WITH DIFFERENTIAL/PLATELET
Basophils Absolute: 0.1 10*3/uL (ref 0.0–0.2)
Basos: 2 %
EOS (ABSOLUTE): 0.4 10*3/uL (ref 0.0–0.4)
Eos: 15 %
Hematocrit: 33.4 % — ABNORMAL LOW (ref 37.5–51.0)
Hemoglobin: 10.6 g/dL — ABNORMAL LOW (ref 13.0–17.7)
Immature Grans (Abs): 0 10*3/uL (ref 0.0–0.1)
Immature Granulocytes: 0 %
Lymphocytes Absolute: 0.8 10*3/uL (ref 0.7–3.1)
Lymphs: 27 %
MCH: 27.7 pg (ref 26.6–33.0)
MCHC: 31.7 g/dL (ref 31.5–35.7)
MCV: 87 fL (ref 79–97)
Monocytes Absolute: 0.3 10*3/uL (ref 0.1–0.9)
Monocytes: 12 %
Neutrophils Absolute: 1.3 10*3/uL — ABNORMAL LOW (ref 1.4–7.0)
Neutrophils: 44 %
Platelets: 238 10*3/uL (ref 150–450)
RBC: 3.83 x10E6/uL — ABNORMAL LOW (ref 4.14–5.80)
RDW: 14.9 % (ref 11.6–15.4)
WBC: 2.8 10*3/uL — ABNORMAL LOW (ref 3.4–10.8)

## 2023-10-20 LAB — LIPID PANEL
Chol/HDL Ratio: 4.4 ratio (ref 0.0–5.0)
Cholesterol, Total: 96 mg/dL — ABNORMAL LOW (ref 100–199)
HDL: 22 mg/dL — ABNORMAL LOW (ref >39)
LDL Chol Calc (NIH): 6 mg/dL (ref 0–99)
Triglycerides: 539 mg/dL — ABNORMAL HIGH (ref 0–149)
VLDL Cholesterol Cal: 68 mg/dL — ABNORMAL HIGH (ref 5–40)

## 2023-10-20 LAB — HEMOGLOBIN A1C
Est. average glucose Bld gHb Est-mCnc: 143 mg/dL
Hgb A1c MFr Bld: 6.6 % — ABNORMAL HIGH (ref 4.8–5.6)

## 2023-10-20 MED ORDER — EMPAGLIFLOZIN 10 MG PO TABS
10.0000 mg | ORAL_TABLET | Freq: Every day | ORAL | 3 refills | Status: DC
Start: 2023-10-20 — End: 2024-01-04

## 2023-11-03 ENCOUNTER — Other Ambulatory Visit: Payer: Self-pay | Admitting: Cardiology

## 2024-01-04 ENCOUNTER — Other Ambulatory Visit: Payer: Self-pay | Admitting: Internal Medicine

## 2024-01-04 ENCOUNTER — Other Ambulatory Visit: Payer: Self-pay | Admitting: Gastroenterology

## 2024-01-04 DIAGNOSIS — E1169 Type 2 diabetes mellitus with other specified complication: Secondary | ICD-10-CM

## 2024-02-20 ENCOUNTER — Ambulatory Visit (HOSPITAL_COMMUNITY)
Admission: RE | Admit: 2024-02-20 | Discharge: 2024-02-20 | Disposition: A | Discharge status: Home / Self Care | Source: Ambulatory Visit | Attending: Internal Medicine | Admitting: Internal Medicine

## 2024-02-20 DIAGNOSIS — Z87891 Personal history of nicotine dependence: Secondary | ICD-10-CM | POA: Diagnosis not present

## 2024-02-20 DIAGNOSIS — Z122 Encounter for screening for malignant neoplasm of respiratory organs: Secondary | ICD-10-CM | POA: Diagnosis not present

## 2024-02-27 ENCOUNTER — Other Ambulatory Visit: Payer: Self-pay | Admitting: Acute Care

## 2024-02-27 DIAGNOSIS — Z122 Encounter for screening for malignant neoplasm of respiratory organs: Secondary | ICD-10-CM

## 2024-02-27 DIAGNOSIS — Z87891 Personal history of nicotine dependence: Secondary | ICD-10-CM

## 2024-02-29 ENCOUNTER — Other Ambulatory Visit: Payer: Self-pay | Admitting: Physician Assistant

## 2024-02-29 ENCOUNTER — Other Ambulatory Visit: Payer: Self-pay | Admitting: Internal Medicine

## 2024-02-29 ENCOUNTER — Other Ambulatory Visit: Payer: Self-pay | Admitting: Cardiology

## 2024-02-29 DIAGNOSIS — E1169 Type 2 diabetes mellitus with other specified complication: Secondary | ICD-10-CM

## 2024-02-29 DIAGNOSIS — I5022 Chronic systolic (congestive) heart failure: Secondary | ICD-10-CM

## 2024-03-21 ENCOUNTER — Ambulatory Visit

## 2024-03-21 VITALS — Ht 68.0 in | Wt 160.0 lb

## 2024-03-21 DIAGNOSIS — Z0000 Encounter for general adult medical examination without abnormal findings: Secondary | ICD-10-CM

## 2024-03-21 DIAGNOSIS — Z23 Encounter for immunization: Secondary | ICD-10-CM

## 2024-03-21 NOTE — Patient Instructions (Signed)
 [name],  Thank you for taking the time for your Medicare Wellness Visit. I appreciate your continued commitment to your health goals. Please review the care plan we discussed, and feel free to reach out if I can assist you further.  Medicare recommends these wellness visits once per year to help you and your care team stay ahead of potential health issues. These visits are designed to focus on prevention, allowing your provider to concentrate on managing your acute and chronic conditions during your regular appointments.  Please note that Annual Wellness Visits do not include a physical exam. Some assessments may be limited, especially if the visit was conducted virtually. If needed, we may recommend a separate in-person follow-up with your provider.  Wishing you excellent health and many blessings in the year to come!  -Shandelle Borrelli, CMA  Ongoing Care Seeing your primary care provider every 3 to 6 months helps us  monitor your health and provide consistent, personalized care.   Recommended Screenings:  Health Maintenance  Topic Date Due   Complete foot exam   Never done   Eye exam for diabetics  Never done   Yearly kidney health urinalysis for diabetes  Never done   Flu Shot  01/06/2024   COVID-19 Vaccine (5 - 2025-26 season) 02/06/2024   Hemoglobin A1C  04/20/2024   Yearly kidney function blood test for diabetes  10/18/2024   Screening for Lung Cancer  02/19/2025   Medicare Annual Wellness Visit  03/21/2025   DTaP/Tdap/Td vaccine (2 - Td or Tdap) 01/29/2028   Colon Cancer Screening  04/13/2028   Pneumococcal Vaccine for age over 81  Completed   Hepatitis C Screening  Completed   Zoster (Shingles) Vaccine  Completed   Meningitis B Vaccine  Aged Out       03/21/2024   12:58 PM  Advanced Directives  Does Patient Have a Medical Advance Directive? No  Would patient like information on creating a medical advance directive? No - Patient declined   Advance Care Planning is important  because it: Ensures you receive medical care that aligns with your values, goals, and preferences. Provides guidance to your family and loved ones, reducing the emotional burden of decision-making during critical moments.  Vision: Annual vision screenings are recommended for early detection of glaucoma, cataracts, and diabetic retinopathy. These exams can also reveal signs of chronic conditions such as diabetes and high blood pressure.  Dental: Annual dental screenings help detect early signs of oral cancer, gum disease, and other conditions linked to overall health, including heart disease and diabetes.  Please see the attached documents for additional preventive care recommendations.
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
## 2024-04-20 ENCOUNTER — Other Ambulatory Visit: Payer: Self-pay | Admitting: Internal Medicine

## 2024-04-20 DIAGNOSIS — E782 Mixed hyperlipidemia: Secondary | ICD-10-CM

## 2024-04-23 ENCOUNTER — Ambulatory Visit (INDEPENDENT_AMBULATORY_CARE_PROVIDER_SITE_OTHER): Admitting: Internal Medicine

## 2024-04-23 ENCOUNTER — Encounter: Payer: Self-pay | Admitting: Internal Medicine

## 2024-04-23 VITALS — BP 100/62 | HR 77 | Ht 68.0 in | Wt 164.2 lb

## 2024-04-23 DIAGNOSIS — Z0001 Encounter for general adult medical examination with abnormal findings: Secondary | ICD-10-CM

## 2024-04-23 DIAGNOSIS — Z7984 Long term (current) use of oral hypoglycemic drugs: Secondary | ICD-10-CM

## 2024-04-23 DIAGNOSIS — E782 Mixed hyperlipidemia: Secondary | ICD-10-CM

## 2024-04-23 DIAGNOSIS — B351 Tinea unguium: Secondary | ICD-10-CM

## 2024-04-23 DIAGNOSIS — E1169 Type 2 diabetes mellitus with other specified complication: Secondary | ICD-10-CM

## 2024-04-23 DIAGNOSIS — D649 Anemia, unspecified: Secondary | ICD-10-CM

## 2024-04-23 DIAGNOSIS — E559 Vitamin D deficiency, unspecified: Secondary | ICD-10-CM

## 2024-04-23 DIAGNOSIS — I5022 Chronic systolic (congestive) heart failure: Secondary | ICD-10-CM | POA: Diagnosis not present

## 2024-04-23 DIAGNOSIS — Z125 Encounter for screening for malignant neoplasm of prostate: Secondary | ICD-10-CM

## 2024-04-23 MED ORDER — TERBINAFINE HCL 250 MG PO TABS: 250.0000 mg | ORAL_TABLET | Freq: Every day | ORAL | 0 refills | Status: AC

## 2024-04-23 NOTE — Patient Instructions (Signed)
 Please start taking Terbinafine as prescribed.  Please continue to take medications as prescribed.  Please continue to follow low salt diet and perform moderate exercise/walking as tolerated.

## 2024-04-23 NOTE — Assessment & Plan Note (Signed)
Likely due to history of alcohol dependence Denies any active signs of bleeding currently Hb stable around 11 Last CBC reviewed

## 2024-04-23 NOTE — Assessment & Plan Note (Signed)
 Ordered PSA after discussing its limitations for prostate cancer screening, including false positive results leading to additional investigations.

## 2024-04-23 NOTE — Assessment & Plan Note (Signed)
 On statin and Vascepa  Check lipid profile

## 2024-04-23 NOTE — Assessment & Plan Note (Signed)
 Started oral terbinafine X 12 weeks Referred to podiatry for diabetic footcare

## 2024-04-23 NOTE — Assessment & Plan Note (Addendum)
 Physical exam as documented. Fasting blood tests today. He is up-to-date with pneumococcal and Shingrix vaccines.

## 2024-04-23 NOTE — Assessment & Plan Note (Addendum)
 On Entresto , Coreg  and HCTZ BP usually low normal Appears euvolemic currently Followed by cardiology

## 2024-04-23 NOTE — Assessment & Plan Note (Addendum)
 Lab Results  Component Value Date   HGBA1C 6.6 (H) 10/19/2023   Well-controlled Associated with HLD and CHF On Jardiance  10 mg QD Advised to follow diabetic diet On statin and Entresto  F/u CMP and lipid panel Diabetic eye exam: Advised to follow up with Ophthalmology for diabetic eye exam
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
## 2024-04-24 ENCOUNTER — Ambulatory Visit: Payer: Self-pay | Admitting: Internal Medicine

## 2024-04-24 ENCOUNTER — Other Ambulatory Visit: Payer: Self-pay | Admitting: Internal Medicine

## 2024-04-24 DIAGNOSIS — E559 Vitamin D deficiency, unspecified: Secondary | ICD-10-CM

## 2024-04-24 LAB — CBC WITH DIFFERENTIAL/PLATELET
Basophils Absolute: 0.1 x10E3/uL (ref 0.0–0.2)
Basos: 2 %
EOS (ABSOLUTE): 0.8 x10E3/uL — ABNORMAL HIGH (ref 0.0–0.4)
Eos: 15 %
Hematocrit: 33.9 % — ABNORMAL LOW (ref 37.5–51.0)
Hemoglobin: 11.2 g/dL — ABNORMAL LOW (ref 13.0–17.7)
Immature Grans (Abs): 0 x10E3/uL (ref 0.0–0.1)
Immature Granulocytes: 0 %
Lymphocytes Absolute: 0.8 x10E3/uL (ref 0.7–3.1)
Lymphs: 15 %
MCH: 28.6 pg (ref 26.6–33.0)
MCHC: 33 g/dL (ref 31.5–35.7)
MCV: 87 fL (ref 79–97)
Monocytes Absolute: 0.5 x10E3/uL (ref 0.1–0.9)
Monocytes: 9 %
Neutrophils Absolute: 3.1 x10E3/uL (ref 1.4–7.0)
Neutrophils: 59 %
Platelets: 189 x10E3/uL (ref 150–450)
RBC: 3.91 x10E6/uL — ABNORMAL LOW (ref 4.14–5.80)
RDW: 14.5 % (ref 11.6–15.4)
WBC: 5.3 x10E3/uL (ref 3.4–10.8)

## 2024-04-24 LAB — CMP14+EGFR
ALT: 14 IU/L (ref 0–44)
AST: 25 IU/L (ref 0–40)
Albumin: 4.7 g/dL (ref 3.8–4.8)
Alkaline Phosphatase: 84 IU/L (ref 47–123)
BUN/Creatinine Ratio: 15 (ref 10–24)
BUN: 15 mg/dL (ref 8–27)
Bilirubin Total: 0.6 mg/dL (ref 0.0–1.2)
CO2: 21 mmol/L (ref 20–29)
Calcium: 10.3 mg/dL — ABNORMAL HIGH (ref 8.6–10.2)
Chloride: 96 mmol/L (ref 96–106)
Creatinine, Ser: 1.01 mg/dL (ref 0.76–1.27)
Globulin, Total: 2.9 g/dL (ref 1.5–4.5)
Glucose: 123 mg/dL — ABNORMAL HIGH (ref 70–99)
Potassium: 3.8 mmol/L (ref 3.5–5.2)
Sodium: 136 mmol/L (ref 134–144)
Total Protein: 7.6 g/dL (ref 6.0–8.5)
eGFR: 79 mL/min/1.73 (ref >59)

## 2024-04-24 LAB — LIPID PANEL
Chol/HDL Ratio: 6.1 ratio — ABNORMAL HIGH (ref 0.0–5.0)
Cholesterol, Total: 207 mg/dL — ABNORMAL HIGH (ref 100–199)
HDL: 34 mg/dL — ABNORMAL LOW (ref >39)
Triglycerides: 1122 mg/dL (ref 0–149)

## 2024-04-24 LAB — HEMOGLOBIN A1C
Est. average glucose Bld gHb Est-mCnc: 134 mg/dL
Hgb A1c MFr Bld: 6.3 % — ABNORMAL HIGH (ref 4.8–5.6)

## 2024-04-24 LAB — VITAMIN D 25 HYDROXY (VIT D DEFICIENCY, FRACTURES): Vit D, 25-Hydroxy: 11.8 ng/mL — ABNORMAL LOW (ref 30.0–100.0)

## 2024-04-24 LAB — PSA: Prostate Specific Ag, Serum: 2.5 ng/mL (ref 0.0–4.0)

## 2024-04-24 LAB — TSH: TSH: 1.72 u[IU]/mL (ref 0.450–4.500)

## 2024-04-24 MED ORDER — VITAMIN D (ERGOCALCIFEROL) 1.25 MG (50000 UNIT) PO CAPS: 50000.0000 [IU] | ORAL_CAPSULE | ORAL | 1 refills | Status: AC

## 2024-04-25 LAB — MICROALBUMIN / CREATININE URINE RATIO
Creatinine, Urine: 98.2 mg/dL
Microalb/Creat Ratio: 41 mg/g{creat} — ABNORMAL HIGH (ref 0–29)
Microalbumin, Urine: 39.9 ug/mL

## 2024-06-07 ENCOUNTER — Encounter: Payer: Self-pay | Admitting: Gastroenterology

## 2024-06-21 ENCOUNTER — Ambulatory Visit (INDEPENDENT_AMBULATORY_CARE_PROVIDER_SITE_OTHER): Admitting: Podiatry

## 2024-06-21 DIAGNOSIS — Z91199 Patient's noncompliance with other medical treatment and regimen due to unspecified reason: Secondary | ICD-10-CM

## 2024-06-25 NOTE — Progress Notes (Signed)
Patient did not show for scheduled appointment today.

## 2024-07-04 ENCOUNTER — Other Ambulatory Visit: Payer: Self-pay | Admitting: Internal Medicine

## 2024-07-04 ENCOUNTER — Other Ambulatory Visit: Payer: Self-pay | Admitting: Cardiology

## 2024-07-04 DIAGNOSIS — E782 Mixed hyperlipidemia: Secondary | ICD-10-CM

## 2024-08-02 ENCOUNTER — Ambulatory Visit: Admitting: Podiatry

## 2024-10-22 ENCOUNTER — Ambulatory Visit: Admitting: Internal Medicine

## 2025-03-26 ENCOUNTER — Ambulatory Visit
